# Patient Record
Sex: Male | Born: 1965 | Race: White | Hispanic: No | Marital: Married | Smoking: Never smoker
Health system: Southern US, Community
[De-identification: ages and names within clinical notes are randomized; demographics above are authoritative.]

## PROBLEM LIST (undated history)

## (undated) DIAGNOSIS — Y9389 Activity, other specified: Secondary | ICD-10-CM

## (undated) DIAGNOSIS — T782XXA Anaphylactic shock, unspecified, initial encounter: Secondary | ICD-10-CM

## (undated) DIAGNOSIS — K219 Gastro-esophageal reflux disease without esophagitis: Secondary | ICD-10-CM

## (undated) DIAGNOSIS — I10 Essential (primary) hypertension: Secondary | ICD-10-CM

## (undated) HISTORY — DX: Anaphylactic shock, unspecified, initial encounter: T78.2XXA

## (undated) HISTORY — DX: Activity, other specified: Y93.89

---

## 2011-11-28 DIAGNOSIS — T782XXA Anaphylactic shock, unspecified, initial encounter: Secondary | ICD-10-CM

## 2011-11-28 HISTORY — DX: Anaphylactic shock, unspecified, initial encounter: T78.2XXA

## 2013-07-31 ENCOUNTER — Emergency Department
Admission: EM | Admit: 2013-07-31 | Discharge: 2013-07-31 | Disposition: A | Discharge status: Home / Self Care | Payer: No Typology Code available for payment source | Source: Home / Self Care | Attending: Emergency Medicine | Admitting: Emergency Medicine

## 2013-07-31 ENCOUNTER — Encounter: Payer: Self-pay | Admitting: Emergency Medicine

## 2013-07-31 DIAGNOSIS — J209 Acute bronchitis, unspecified: Secondary | ICD-10-CM

## 2013-07-31 MED ORDER — AZITHROMYCIN 250 MG PO TABS: ORAL_TABLET | ORAL | Status: DC

## 2013-07-31 MED ORDER — FLUTICASONE PROPIONATE 50 MCG/ACT NA SUSP
NASAL | Status: DC
Start: 2013-07-31 — End: 2015-02-26

## 2013-07-31 MED ORDER — PROMETHAZINE-CODEINE 6.25-10 MG/5ML PO SYRP: ORAL_SOLUTION | ORAL | Status: DC

## 2013-07-31 NOTE — ED Provider Notes (Signed)
CSN: [id number]     Arrival date & time 07/31/13  1257 History   First MD Initiated Contact with Patient 07/31/13 1302     Chief Complaint  Patient presents with  . Cough    x 5 days  . Fever    x 5 days  . Generalized Body Aches    x 5 days   (Consider location/radiation/quality/duration/timing/severity/associated sxs/prior Treatment) HPI URI HISTORY  Onalee HuaDavid is a 48 y.o. male who complains of onset of cold symptoms for 5 days.  Have been using over-the-counter treatment which helps a little bit.--- It's progressively worsening  No chills/sweats +  Fever  +  Nasal congestion +  Discolored, green Post-nasal drainage + sinus pain/pressure No sore throat  +  Hacking, nonproductive cough No wheezing + mild chest congestion No hemoptysis No shortness of breath No pleuritic pain  No itchy/red eyes No earache  No nausea No vomiting No abdominal pain No diarrhea  No skin rashes +  Fatigue No myalgias No headache   History reviewed. No pertinent past medical history. History reviewed. No pertinent past surgical history. History reviewed. No pertinent family history. History  Substance Use Topics  . Smoking status: Never Smoker   . Smokeless tobacco: Never Used  . Alcohol Use: No    Review of Systems  All other systems reviewed and are negative.    Allergies  Penicillins  Home Medications   Current Outpatient Rx  Name  Route  Sig  Dispense  Refill  . cetirizine (ZYRTEC) 10 MG tablet   Oral   Take 10 mg by mouth daily.         . pseudoephedrine-guaifenesin (MUCINEX D) 60-600 MG per tablet   Oral   Take 1 tablet by mouth every 12 (twelve) hours.         Marland Kitchen. azithromycin (ZITHROMAX Z-PAK) 250 MG tablet      Take 2 tablets on day one, then 1 tablet daily on days 2 through 5   1 each   0   . fluticasone (FLONASE) 50 MCG/ACT nasal spray      1 or 2 sprays each nostril twice a day   16 g   0   . promethazine-codeine (PHENERGAN WITH CODEINE)  6.25-10 MG/5ML syrup      Take 1-2 teaspoons every 4-6 hours as needed for cough. May cause drowsiness.   120 mL   0    BP 167/112  Pulse 80  Temp(Src) 99.1 F (37.3 C) (Oral)  Ht 5\' 10"  (1.778 m)  Wt 200 lb (90.719 kg)  BMI 28.70 kg/m2  SpO2 96% Physical Exam  Nursing note and vitals reviewed. Constitutional: He is oriented to person, place, and time. He appears well-developed and well-nourished. No distress.  HENT:  Head: Normocephalic and atraumatic.  Right Ear: Tympanic membrane, external ear and ear canal normal.  Left Ear: Tympanic membrane, external ear and ear canal normal.  Nose: Mucosal edema and rhinorrhea present. Right sinus exhibits maxillary sinus tenderness. Left sinus exhibits maxillary sinus tenderness.  Mouth/Throat: Oropharynx is clear and moist. No oral lesions. No oropharyngeal exudate.  Eyes: Right eye exhibits no discharge. Left eye exhibits no discharge. No scleral icterus.  Neck: Neck supple.  Cardiovascular: Normal rate, regular rhythm and normal heart sounds.   Pulmonary/Chest: Effort normal. No respiratory distress. He has no wheezes. He has rhonchi. He has no rales.  Lymphadenopathy:    He has no cervical adenopathy.  Neurological: He is alert and oriented to person, place,  and time.  Skin: Skin is warm and dry.    ED Course  Procedures (including critical care time) Labs Review Labs Reviewed - No data to display Imaging Review No results found.   MDM   1. Acute bronchitis    Acute bronchitis and acute maxillary sinusitis. Treatment options discussed, as well as risks, benefits, alternatives. Patient voiced understanding and agreement with the following plans: Z-Pak Flonase Phenergan with codeine cough syrup prescribed, precautions discussed I recheck BP right arm sitting, BP 145/88.--Advised to avoid decongestants which could raise BP. Use Mucinex and other symptomatic care discussed Followup with PCP within 2 weeks to have BP  rechecked, sooner if worse or new symptoms. Precautions discussed. Red flags discussed. Questions invited and answered. Patient and wife voiced understanding and agreement.     Lajean Manes, MD 07/31/13 [phone]

## 2013-07-31 NOTE — ED Notes (Signed)
Mark HuaDavid complains of fevers, body aches, dizziness, headaches, runny nose, sneezing, congestion, wheezing, productive cough with green sputum, shortness of breath and chest pain for 5 days.

## 2013-10-21 ENCOUNTER — Emergency Department
Admission: EM | Admit: 2013-10-21 | Discharge: 2013-10-21 | Disposition: A | Discharge status: Home / Self Care | Payer: No Typology Code available for payment source | Source: Home / Self Care | Attending: Emergency Medicine | Admitting: Emergency Medicine

## 2013-10-21 ENCOUNTER — Encounter: Payer: Self-pay | Admitting: Emergency Medicine

## 2013-10-21 DIAGNOSIS — J069 Acute upper respiratory infection, unspecified: Secondary | ICD-10-CM

## 2013-10-21 HISTORY — DX: Essential (primary) hypertension: I10

## 2013-10-21 HISTORY — DX: Gastro-esophageal reflux disease without esophagitis: K21.9

## 2013-10-21 LAB — POCT RAPID STREP A (OFFICE): Rapid Strep A Screen: NEGATIVE

## 2013-10-21 MED ORDER — AZITHROMYCIN 250 MG PO TABS: ORAL_TABLET | ORAL | Status: DC

## 2013-10-21 MED ORDER — METHYLPREDNISOLONE SODIUM SUCC 125 MG IJ SOLR
125.0000 mg | Freq: Once | INTRAMUSCULAR | Status: AC
  Administered 2013-10-21: 125 mg via INTRAMUSCULAR

## 2013-10-21 NOTE — ED Notes (Signed)
Pt c/o body aches, productive cough, and night sweats x 3 days.

## 2013-10-21 NOTE — ED Provider Notes (Signed)
CSN: [id number]     Arrival date & time 10/21/13  1123 History   First MD Initiated Contact with Patient 10/21/13 1125     Chief Complaint  Patient presents with  . Cough  . Generalized Body Aches   (Consider location/radiation/quality/duration/timing/severity/associated sxs/prior Treatment) HPI Mark Romero is a 48 y.o. male who complains of onset of cold symptoms for 4 days.  The symptoms are constant and mild-moderate in severity.  He is mostly concerned because a few months ago he had similar symptoms that turned much worse.  He was around some and this past weekend who has been sick for about a month.  Also possible exposure to strep throat. +  sore throat + cough No pleuritic pain No wheezing + nasal congestion + post-nasal drainage No sinus pain/pressure + chest congestion No itchy/red eyes No earache No hemoptysis No SOB + chills/sweats No fever No nausea No vomiting No abdominal pain No diarrhea No skin rashes + fatigue No headache     Past Medical History  Diagnosis Date  . Hypertension   . GERD (gastroesophageal reflux disease)    History reviewed. No pertinent past surgical history. History reviewed. No pertinent family history. History  Substance Use Topics  . Smoking status: Never Smoker   . Smokeless tobacco: Never Used  . Alcohol Use: No    Review of Systems  All other systems reviewed and are negative.   Allergies  Penicillins  Home Medications   Prior to Admission medications   Medication Sig Start Date End Date Taking? Authorizing Provider  azithromycin (ZITHROMAX Z-PAK) 250 MG tablet Take 2 tablets on day one, then 1 tablet daily on days 2 through 5 07/31/13   Lajean Manesavid Massey, MD  cetirizine (ZYRTEC) 10 MG tablet Take 10 mg by mouth daily.    Historical Provider, MD  fluticasone Aleda Grana(FLONASE) 50 MCG/ACT nasal spray 1 or 2 sprays each nostril twice a day 07/31/13   Lajean Manesavid Massey, MD  promethazine-codeine [facility](PHENERGAN WITH CODEINE) 6.25-10 MG/5ML syrup Take  1-2 teaspoons every 4-6 hours as needed for cough. May cause drowsiness. 07/31/13   Lajean Manesavid Massey, MD  pseudoephedrine-guaifenesin [facility](MUCINEX D) 60-600 MG per tablet Take 1 tablet by mouth every 12 (twelve) hours.    Historical Provider, MD   BP 122/82  Pulse 66  Temp(Src) 98.1 F (36.7 C) (Oral)  Resp 18  Ht 5\' 10"  (1.778 m)  Wt 192 lb (87.091 kg)  BMI 27.55 kg/m2  SpO2 98% Physical Exam  Nursing note and vitals reviewed. Constitutional: He is oriented to person, place, and time. He appears well-developed and well-nourished.  HENT:  Head: Normocephalic and atraumatic.  Right Ear: Tympanic membrane, external ear and ear canal normal.  Left Ear: Tympanic membrane, external ear and ear canal normal.  Nose: Mucosal edema and rhinorrhea present.  Mouth/Throat: Posterior oropharyngeal erythema present. No oropharyngeal exudate or posterior oropharyngeal edema.  Eyes: No scleral icterus.  Neck: Neck supple.  Cardiovascular: Regular rhythm and normal heart sounds.   Pulmonary/Chest: Effort normal and breath sounds normal. No respiratory distress. He has no decreased breath sounds. He has no wheezes. He has no rhonchi.  Neurological: He is alert and oriented to person, place, and time.  Skin: Skin is warm and dry.  Psychiatric: He has a normal mood and affect. His speech is normal.    ED Course  Procedures (including critical care time) Labs Review Labs Reviewed  POCT RAPID STREP A (OFFICE)    Imaging Review No results found.   MDM  1. Acute upper respiratory infections of unspecified site    1)  Take the prescribed antibiotic as instructed.  IM Solumedrol given.  Rapid strep neg, no culture. 2)  Use nasal saline solution (over the counter) at least 3 times a day. 3)  Use over the counter decongestants like Zyrtec-D every 12 hours as needed to help with congestion.  If you have hypertension, do not take medicines with sudafed.  4)  Can take tylenol every 6 hours or motrin every 8  hours for pain or fever. 5)  Follow up with your primary doctor if no improvement in 5-7 days, sooner if increasing pain, fever, or new symptoms.       Marlaine Hind, MD 10/21/13 [phone]

## 2015-02-26 ENCOUNTER — Encounter: Payer: Self-pay | Admitting: Sports Medicine

## 2015-02-26 ENCOUNTER — Ambulatory Visit (INDEPENDENT_AMBULATORY_CARE_PROVIDER_SITE_OTHER): Payer: No Typology Code available for payment source | Admitting: Sports Medicine

## 2015-02-26 ENCOUNTER — Ambulatory Visit (INDEPENDENT_AMBULATORY_CARE_PROVIDER_SITE_OTHER): Payer: No Typology Code available for payment source

## 2015-02-26 VITALS — BP 168/106 | HR 67 | Ht 70.0 in | Wt 197.0 lb

## 2015-02-26 DIAGNOSIS — M5416 Radiculopathy, lumbar region: Secondary | ICD-10-CM | POA: Diagnosis not present

## 2015-02-26 DIAGNOSIS — I10 Essential (primary) hypertension: Secondary | ICD-10-CM

## 2015-02-26 MED ORDER — MELOXICAM 15 MG PO TABS: ORAL_TABLET | ORAL | Status: DC

## 2015-02-26 MED ORDER — LISINOPRIL-HYDROCHLOROTHIAZIDE 10-12.5 MG PO TABS: 1.0000 | ORAL_TABLET | Freq: Every day | ORAL | Status: DC

## 2015-02-26 MED ORDER — PREDNISONE 50 MG PO TABS: ORAL_TABLET | ORAL | Status: DC

## 2015-02-26 NOTE — Assessment & Plan Note (Signed)
We will start conservatively with formal physical therapy, x-rays, prednisone, meloxicam. He will return for custom orthotics.  Return to see me in one month after that.

## 2015-02-26 NOTE — Assessment & Plan Note (Signed)
It sounds as the patient is done some dieting and exercise. Blood pressure continues to be elevated so we will start lisinopril/chlorothiazide.

## 2015-02-26 NOTE — Progress Notes (Signed)
   Subjective:    I'm seeing this patient as a consultation for:  Dr. Angelena Sole  CC: leg numbness and tingling  HPI: For over a year this pleasant 49 year old male has had numbness and tingling that runs down the posterior lateral aspect of his left thigh, to the left lower leg but not to the foot, symptoms are moderate, persistent, worse with running, standing. They are not worse with flexion, Valsalva, or driving a car, no constitutional symptoms and no bowel or bladder dysfunction.  Elevated blood pressure: Persistent, he has tried last on modification but unfortunately continues to be elevated, no headaches, visual changes, chest pain.  Past medical history, Surgical history, Family history not pertinant except as noted below, Social history, Allergies, and medications have been entered into the medical record, reviewed, and no changes needed.   Review of Systems: No headache, visual changes, nausea, vomiting, diarrhea, constipation, dizziness, abdominal pain, skin rash, fevers, chills, night sweats, weight loss, swollen lymph nodes, body aches, joint swelling, muscle aches, chest pain, shortness of breath, mood changes, visual or auditory hallucinations.   Objective:   General: Well Developed, well nourished, and in no acute distress.  Neuro/Psych: Alert and oriented x3, extra-ocular muscles intact, able to move all 4 extremities, sensation grossly intact. Skin: Warm and dry, no rashes noted.  Respiratory: Not using accessory muscles, speaking in full sentences, trachea midline.  Cardiovascular: Pulses palpable, no extremity edema. Abdomen: Does not appear distended. Back Exam:  Inspection: Unremarkable  Motion: Flexion 45 deg, Extension 45 deg, Side Bending to 45 deg bilaterally,  Rotation to 45 deg bilaterally  SLR laying: Negative  XSLR laying: Negative  Palpable tenderness: None. FABER: negative. Sensory change: Gross sensation intact to all lumbar and sacral dermatomes.   Reflexes: 2+ at both patellar tendons, 2+ at achilles tendons, Babinski's downgoing.  Strength at foot  Plantar-flexion: 5/5 Dorsi-flexion: 5/5 Eversion: 5/5 Inversion: 5/5  Leg strength  Quad: 5/5 Hamstring: 5/5 Hip flexor: 5/5 Hip abductors: 5/5  Gait unremarkable.  X-rays do show multilevel lumbar spondylosis but particularly with a L3 pars interarticularis defect bilaterally. No evidence of spondylolisthesis.  Impression and Recommendations:   This case required medical decision making of moderate complexity.

## 2015-03-01 ENCOUNTER — Encounter: Payer: Self-pay | Admitting: Rehabilitative and Restorative Service Providers"

## 2015-03-01 ENCOUNTER — Ambulatory Visit (INDEPENDENT_AMBULATORY_CARE_PROVIDER_SITE_OTHER): Payer: No Typology Code available for payment source | Admitting: Rehabilitative and Restorative Service Providers"

## 2015-03-01 DIAGNOSIS — Z7409 Other reduced mobility: Secondary | ICD-10-CM

## 2015-03-01 DIAGNOSIS — M623 Immobility syndrome (paraplegic): Secondary | ICD-10-CM

## 2015-03-01 DIAGNOSIS — M2560 Stiffness of unspecified joint, not elsewhere classified: Secondary | ICD-10-CM

## 2015-03-01 DIAGNOSIS — M5442 Lumbago with sciatica, left side: Secondary | ICD-10-CM | POA: Diagnosis not present

## 2015-03-01 NOTE — Patient Instructions (Signed)
Trunk: Prone Extension (Press-Ups)    Lie on stomach on firm, flat surface. Relax bottom and legs. Raise chest in air with elbows straight. Keep hips flat on surface, sag stomach. Hold __5__ seconds. Repeat __10__ times. Do _2-5___ sessions per day. CAUTION: Movement should be gentle and slow.    Prone prop - prop on elbows or forearms 1-5 min 2-3 times/day    Trunk Extension  Standing, place back of open hands on low back. Straighten spine then arch the back and move shoulders back. Repeat __2-3__ times per session. Do __2-3__ sessions per day    Gastroc Stretch  Stand with right foot back, leg straight, forward leg bent. Keeping heel on floor, turned slightly out, lean into wall until stretch is felt in calf. Hold ____ seconds. Repeat ____ times per set. Do ____ sets per session. Do ____ sessions per day.    Achilles / Soleus, Standing  Stand, right foot behind, heel on floor and turned slightly out. Lower hips and bend knees. Hold ___ seconds. Repeat ___ times per session. Do ___ sessions per day.    Abdominal Bracing With Pelvic Floor (Hook-Lying)    With neutral spine, tighten pelvic floor and abdominals, suck belly button to back bone. Tighten muscles in back at waist. Hold 10 sec  Repeat _10__ times. Do _several__ times a day. Progress to do this exercise in sitting; standing; and walking as well as with functional activities.

## 2015-03-01 NOTE — Therapy (Addendum)
[facility] [address] [place], [place], [address] Phone: [phone]   Fax:  [phone]  Physical Therapy Treatment  Patient Details  Name: Mark Romero MRN: [id number] Date of Birth: 08/09/1965 Referring Provider:  Silverio Decamp,*  Encounter Date: 03/01/2015      PT End of Session - 03/01/15 1533    Visit Number 1   Number of Visits 6   Date for PT Re-Evaluation 04/12/15   PT Start Time 2703   PT Stop Time 1620   PT Time Calculation (min) 46 min   Activity Tolerance Patient tolerated treatment well      Past Medical History  Diagnosis Date  . Hypertension   . GERD (gastroesophageal reflux disease)     History reviewed. No pertinent past surgical history.  There were no vitals filed for this visit.  Visit Diagnosis:  Left-sided low back pain with left-sided sciatica - Plan: PT plan of care cert/re-cert  Stiffness due to immobility - Plan: PT plan of care cert/re-cert  Impaired mobility and endurance - Plan: PT plan of care cert/re-cert      Subjective Assessment - 03/01/15 1539    Subjective Patient reports that he has had back pain for a period of 5-6 years with symptoms flaring up intermittently. Dx with DDD L1/2 and L3/4. Patient reports pain with running; prolonged standing; walking. Symptoms worse in the past year. Worse when running 3-5 miles.    Pertinent History LBP; Lt sciatica - has tried massage with no improvement.    How long can you sit comfortably? no limit   How long can you stand comfortably? 15-20 min symptoms LB Lt LE down into leg/calf   How long can you walk comfortably? 20-30 min symptoms in LB and Lt LE knee down goes to sleep   Diagnostic tests MRI   Patient [place] again - would like to run 3-5 miles without calf pain   Currently in Pain? Yes   Pain Score 0-No pain   Pain Location Back   Pain Orientation Left   Pain Descriptors / Indicators Dull;Aching  when  he has pain   Pain Type Chronic pain   Pain Radiating Towards inot Lt hip and leg    Pain Onset More than a month ago   Pain Frequency Intermittent   Aggravating Factors  standing; walking; running   Pain Relieving Factors stopping the activitiy; deep tissue massage temp relief                                 PT Education - 03/01/15 1811    Education provided Yes   Education Details Spine education; body mechanics; extension program; stretching for gastroc/soleus; HEP   Person(s) Educated Patient   Methods Explanation;Demonstration;Tactile cues;Verbal cues;Handout   Comprehension Verbalized understanding;Returned demonstration;Verbal cues required;Tactile cues required             PT Long Term Goals - 03/01/15 1824    PT LONG TERM GOAL #1   Title Patient I in HEP at discharge 04/12/15   Time 6   Period Weeks   Status New   PT LONG TERM GOAL #2   Title Patient to tolerate standing and walking for 45-60 min 04/12/15   Time 6   Period Weeks   Status New   PT LONG TERM GOAL #3   Title [place] to run 1-2 miles with no calf tightness/spasms 04/12/15  Time 6   Period Weeks   Status New   PT LONG TERM GOAL #4   Title Improve FOTO to </= 27% limitation 04/12/15   Time 6   Period Weeks               Plan - 03/01/15 1813    Clinical Impression Statement Patient presents with history of chornic, recurrent LBP with Lt LE radicular pain. He has had increased symptoms over the past year including LBP and Lt hip/buttock and LE numbness. He also c/o's of pain and spasms in bilat calves at different times. Lumbar and Lt hip/buttock symptoms appear discogenic in nature and calf pain appears to be muscular  and related to adaptive shortening with functional activities worsened by running. He will benefit from PT to improve core stability and begin [place] extension program for lumbar spine and appropriate stretching program for LE's.    Pt will benefit  from skilled therapeutic intervention in order to improve on the following deficits Pain;Increased fascial restricitons;Increased muscle spasms;Decreased activity tolerance;Decreased endurance   Rehab Potential Good   PT Frequency 1x / week   PT Duration 6 weeks   PT Treatment/Interventions Patient/family education;ADLs/Self Care Home Management;Therapeutic exercise;Therapeutic activities;Neuromuscular re-education;Dry needling;Cryotherapy;Electrical Stimulation;Moist Heat;Ultrasound;Traction   PT Next Visit Plan review HEP; progress with extensioin program for lumbar spine; progress with core stabilization program   PT Home Exercise Plan HEP   Consulted and Agree with Plan of Care Patient        Problem List Patient Active Problem List   Diagnosis Date Noted  . Left lumbar radiculopathy 02/26/2015  . Essential hypertension, benign 02/26/2015    Celyn Nilda Simmer PT, MPH 03/02/2015, 9:36 AM  [facility] [place], [place], [address] Phone: [phone]   Fax:  [phone]     PHYSICAL THERAPY DISCHARGE SUMMARY  Visits from Start of Care: eval only  Current functional level related to goals / functional outcomes: Patient was seen for evaluation only and has not returned for further treatment.    Remaining deficits: unknown   Education / Equipment: HEP  Plan: Patient agrees to discharge.  Patient goals were not met. Patient is being discharged due to not returning since the last visit.  ?????    Celyn P. Helene Kelp PT, MPH 03/28/2015 1:12 PM

## 2015-03-06 ENCOUNTER — Encounter: Payer: Self-pay | Admitting: Sports Medicine

## 2015-03-06 ENCOUNTER — Ambulatory Visit (INDEPENDENT_AMBULATORY_CARE_PROVIDER_SITE_OTHER): Payer: No Typology Code available for payment source | Admitting: Sports Medicine

## 2015-03-06 VITALS — BP 150/94 | HR 68 | Ht 70.0 in | Wt 195.0 lb

## 2015-03-06 DIAGNOSIS — I10 Essential (primary) hypertension: Secondary | ICD-10-CM | POA: Diagnosis not present

## 2015-03-06 DIAGNOSIS — M5416 Radiculopathy, lumbar region: Secondary | ICD-10-CM

## 2015-03-06 MED ORDER — LISINOPRIL-HYDROCHLOROTHIAZIDE 20-25 MG PO TABS: 1.0000 | ORAL_TABLET | Freq: Every day | ORAL | Status: DC

## 2015-03-06 NOTE — Progress Notes (Signed)

## 2015-03-06 NOTE — Assessment & Plan Note (Signed)
Improving with physical therapy, has not on prednisone, custom orthotics as above, return to see me in 3 weeks.

## 2015-03-06 NOTE — Assessment & Plan Note (Signed)
Still elevated but has only had about 1 week of blood pressure medication. We are going to double lisinopril/chlorothiazide.

## 2015-03-07 ENCOUNTER — Encounter: Payer: No Typology Code available for payment source | Admitting: Physical Therapy

## 2015-03-27 ENCOUNTER — Ambulatory Visit (INDEPENDENT_AMBULATORY_CARE_PROVIDER_SITE_OTHER): Payer: No Typology Code available for payment source | Admitting: Sports Medicine

## 2015-03-27 ENCOUNTER — Encounter: Payer: Self-pay | Admitting: Sports Medicine

## 2015-03-27 VITALS — BP 139/85 | HR 68 | Ht 70.0 in | Wt 196.0 lb

## 2015-03-27 DIAGNOSIS — I10 Essential (primary) hypertension: Secondary | ICD-10-CM

## 2015-03-27 DIAGNOSIS — M5416 Radiculopathy, lumbar region: Secondary | ICD-10-CM

## 2015-03-27 NOTE — Assessment & Plan Note (Signed)
Controlled, return as needed.

## 2015-03-27 NOTE — Assessment & Plan Note (Signed)
Persistent left-sided lumbar radiculopathy that did improve significantly with physical therapy and orthotics. At this point we are going to proceed with an MRI for interventional planning.

## 2015-03-27 NOTE — Progress Notes (Signed)
  Subjective:    CC: follow-up  HPI: Hypertension: Now controlled.  Left lumbar radiculopathy: Improved significantly with physical therapy, he does however have some persistent pain, and desires to proceed to the next step, radiculopathy is predominantly left-sided L5 versus S1.  Past medical history, Surgical history, Family history not pertinant except as noted below, Social history, Allergies, and medications have been entered into the medical record, reviewed, and no changes needed.   Review of Systems: No fevers, chills, night sweats, weight loss, chest pain, or shortness of breath.   Objective:    General: Well Developed, well nourished, and in no acute distress.  Neuro: Alert and oriented x3, extra-ocular muscles intact, sensation grossly intact.  HEENT: Normocephalic, atraumatic, pupils equal round reactive to light, neck supple, no masses, no lymphadenopathy, thyroid nonpalpable.  Skin: Warm and dry, no rashes. Cardiac: Regular rate and rhythm, no murmurs rubs or gallops, no lower extremity edema.  Respiratory: Clear to auscultation bilaterally. Not using accessory muscles, speaking in full sentences.  Impression and Recommendations:    I spent 25 minutes with this patient, greater than 50% was face-to-face time counseling regarding the above diagnoses

## 2015-04-09 ENCOUNTER — Ambulatory Visit (INDEPENDENT_AMBULATORY_CARE_PROVIDER_SITE_OTHER): Payer: No Typology Code available for payment source

## 2015-04-09 DIAGNOSIS — M5126 Other intervertebral disc displacement, lumbar region: Secondary | ICD-10-CM | POA: Diagnosis not present

## 2015-04-09 DIAGNOSIS — M5416 Radiculopathy, lumbar region: Secondary | ICD-10-CM

## 2015-04-16 ENCOUNTER — Telehealth: Payer: Self-pay | Admitting: Sports Medicine

## 2015-04-16 NOTE — Telephone Encounter (Signed)
Pt called to get results from recent MRI L-Spine. Will route to ordering Provider for review.

## 2015-04-16 NOTE — Telephone Encounter (Signed)
Attempted to return clinic call. Left voicemail informing Pt to contact clinic for MRI results and to schedule a follow up appointment to look over imaging in person. Callback information was provided.

## 2015-04-16 NOTE — Telephone Encounter (Signed)
There are bilateral pars defects at the L3 vertebrae, with crowding of the L3 nerve roots. Should probably follow-up to look at the images with me and come up with a good plan, this will likely not need surgery, and we can probably get this better with conservative measures including injections.

## 2015-04-18 ENCOUNTER — Ambulatory Visit (INDEPENDENT_AMBULATORY_CARE_PROVIDER_SITE_OTHER): Payer: No Typology Code available for payment source | Admitting: Sports Medicine

## 2015-04-18 DIAGNOSIS — M5416 Radiculopathy, lumbar region: Secondary | ICD-10-CM

## 2015-04-18 NOTE — Assessment & Plan Note (Signed)
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 We are going to proceed with a left L3-L4 interlaminar epidural.  Return to see me one month after the injection to evaluate response.

## 2015-04-18 NOTE — Progress Notes (Signed)
  Subjective:    CC: MRI results  HPI: Left lumbar radiculopathy: Mark Romero returns, he continues to have left-sided paresthesias, in no specific distribution, we obtained an MRI, the results of which will be dictated below. Symptoms continue to be moderate, persistent, no bowel or bladder dysfunction, saddle numbness, constitutional symptoms.  Past medical history, Surgical history, Family history not pertinant except as noted below, Social history, Allergies, and medications have been entered into the medical record, reviewed, and no changes needed.   Review of Systems: No fevers, chills, night sweats, weight loss, chest pain, or shortness of breath.   Objective:    General: Well Developed, well nourished, and in no acute distress.  Neuro: Alert and oriented x3, extra-ocular muscles intact, sensation grossly intact.  HEENT: Normocephalic, atraumatic, pupils equal round reactive to light, neck supple, no masses, no lymphadenopathy, thyroid nonpalpable.  Skin: Warm and dry, no rashes. Cardiac: Regular rate and rhythm, no murmurs rubs or gallops, no lower extremity edema.  Respiratory: Clear to auscultation bilaterally. Not using accessory muscles, speaking in full sentences.  MRI personally reviewed, shows bilateral pars defects at the L3-L4 level, with normal anterolisthesis, L3-L4 degenerative disc disease with a broad-based biforaminal component, as well as L4-L5 degenerative disc disease, broad-based without any neural impingement.  Impression and Recommendations:    I spent 25 minutes with this patient, greater than 50% was face-to-face time counseling regarding the above diagnoses

## 2015-05-16 ENCOUNTER — Ambulatory Visit
Admission: RE | Admit: 2015-05-16 | Discharge: 2015-05-16 | Disposition: A | Discharge status: Home / Self Care | Payer: No Typology Code available for payment source | Source: Ambulatory Visit | Attending: Sports Medicine | Admitting: Sports Medicine

## 2015-05-16 MED ORDER — IOHEXOL 180 MG/ML  SOLN
1.0000 mL | Freq: Once | INTRAMUSCULAR | Status: AC | PRN
  Administered 2015-05-16: 1 mL via EPIDURAL

## 2015-05-16 MED ORDER — METHYLPREDNISOLONE ACETATE 40 MG/ML INJ SUSP (RADIOLOG
120.0000 mg | Freq: Once | INTRAMUSCULAR | Status: AC
  Administered 2015-05-16: 120 mg via EPIDURAL

## 2015-05-16 NOTE — Discharge Instructions (Signed)

## 2015-06-18 ENCOUNTER — Telehealth: Payer: Self-pay

## 2015-06-18 DIAGNOSIS — M5416 Radiculopathy, lumbar region: Secondary | ICD-10-CM

## 2015-06-18 NOTE — Telephone Encounter (Signed)
Pt would like to have a referral placed for another shot [facility]. Please assist.

## 2015-06-18 NOTE — Telephone Encounter (Signed)
Injection ordered

## 2015-06-19 NOTE — Telephone Encounter (Signed)
Contacted [facility] and left pt a msg saying order was placed.

## 2015-06-22 ENCOUNTER — Ambulatory Visit
Admission: RE | Admit: 2015-06-22 | Discharge: 2015-06-22 | Disposition: A | Discharge status: Home / Self Care | Payer: BLUE CROSS/BLUE SHIELD | Source: Ambulatory Visit | Attending: Sports Medicine | Admitting: Sports Medicine

## 2015-06-22 MED ORDER — IOHEXOL 180 MG/ML  SOLN
1.0000 mL | Freq: Once | INTRAMUSCULAR | Status: AC | PRN
  Administered 2015-06-22: 1 mL via EPIDURAL

## 2015-06-22 MED ORDER — METHYLPREDNISOLONE ACETATE 40 MG/ML INJ SUSP (RADIOLOG
120.0000 mg | Freq: Once | INTRAMUSCULAR | Status: AC
  Administered 2015-06-22: 120 mg via EPIDURAL

## 2015-07-23 ENCOUNTER — Other Ambulatory Visit: Payer: Self-pay | Admitting: Sports Medicine

## 2015-08-13 ENCOUNTER — Encounter: Payer: Self-pay | Admitting: *Deleted

## 2015-08-13 ENCOUNTER — Emergency Department (INDEPENDENT_AMBULATORY_CARE_PROVIDER_SITE_OTHER)
Admission: EM | Admit: 2015-08-13 | Discharge: 2015-08-13 | Disposition: A | Discharge status: Home / Self Care | Payer: BLUE CROSS/BLUE SHIELD | Source: Home / Self Care | Attending: Family Medicine | Admitting: Family Medicine

## 2015-08-13 DIAGNOSIS — H66005 Acute suppurative otitis media without spontaneous rupture of ear drum, recurrent, left ear: Secondary | ICD-10-CM

## 2015-08-13 DIAGNOSIS — H6092 Unspecified otitis externa, left ear: Secondary | ICD-10-CM | POA: Diagnosis not present

## 2015-08-13 MED ORDER — CIPROFLOXACIN-DEXAMETHASONE 0.3-0.1 % OT SUSP: 4.0000 [drp] | Freq: Two times a day (BID) | OTIC | Status: DC

## 2015-08-13 MED ORDER — CEFDINIR 300 MG PO CAPS: 300.0000 mg | ORAL_CAPSULE | Freq: Two times a day (BID) | ORAL | Status: DC

## 2015-08-13 NOTE — ED Notes (Signed)
Pt c/o LT ear pain x 1 wk. Denies fever. Reports hx of Otitis Media.

## 2015-08-13 NOTE — ED Provider Notes (Signed)
CSN: [id number]     Arrival date & time 08/13/15  1702 History   First MD Initiated Contact with Patient 08/13/15 1727     Chief Complaint  Patient presents with  . Otalgia      HPI Comments: Patient complains of left earache for about 8 days.  He has a history of recurrent otitis media.  The history is provided by the patient.    Past Medical History  Diagnosis Date  . Hypertension   . GERD (gastroesophageal reflux disease)    History reviewed. No pertinent past surgical history. History reviewed. No pertinent family history. Social History  Substance Use Topics  . Smoking status: Never Smoker   . Smokeless tobacco: Never Used  . Alcohol Use: No    Review of Systems No sore throat No cough No pleuritic pain No wheezing No nasal congestion No post-nasal drainage No sinus pain/pressure No itchy/red eyes + earache No hemoptysis No SOB No fever/chills No nausea No vomiting No abdominal pain No diarrhea No urinary symptoms No skin rash No fatigue No myalgias No headache Used OTC meds without relief  Allergies  Penicillins  Home Medications   Prior to Admission medications   Medication Sig Start Date End Date Taking? Authorizing Provider  cefdinir (OMNICEF) 300 MG capsule Take 1 capsule (300 mg total) by mouth 2 (two) times daily. 08/13/15   Lattie HawStephen A Ginia Rudell, MD  cetirizine (ZYRTEC) 10 MG tablet Take 10 mg by mouth daily.    Historical Provider, MD  ciprofloxacin-dexamethasone (CIPRODEX) otic suspension Place 4 drops into the left ear 2 (two) times daily. 08/13/15   Lattie HawStephen A Clerance Umland, MD  lisinopril-hydrochlorothiazide (PRINZIDE,ZESTORETIC) 20-25 MG tablet TAKE ONE TABLET BY MOUTH EVERY DAY 07/23/15   Monica Bectonhomas J Thekkekandam, MD   Meds Ordered and Administered this Visit  Medications - No data to display  BP 135/89 mmHg  Pulse 63  Temp(Src) 98.2 F (36.8 C) (Oral)  Resp 16  Ht 5\' 10"  (1.778 m)  Wt 199 lb (90.266 kg)  BMI 28.55 kg/m2  SpO2 98% No data  found.   Physical Exam Nursing notes and Vital Signs reviewed. Appearance:  Patient appears stated age, and in no acute distress Eyes:  Pupils are equal, round, and reactive to light and accomodation.  Extraocular movement is intact.  Conjunctivae are not inflamed  Ears:  Canals normal although there is tenderness with insertion of speculum in the left canal.  Right tympanic membrane normal.  Left tympanic membrane erythematous and opaque with decreased landmarks. Nose:  Mildly congested turbinates.  No sinus tenderness.  Pharynx:  Normal Neck:  Supple.   No adenopathy. Skin:  No rash present.   ED Course  Procedures none    Labs Reviewed -  Tympanogram:  Left ear low peak height;  Right ear positive peak pressure    MDM   1. Recurrent acute suppurative otitis media without spontaneous rupture of left tympanic membrane   2. Left otitis externa    Begin Omnicef, and Ciprodex Otic suspension May use Afrin nasal spray (or generic oxymetazoline) twice daily for about 5 days and then discontinue.  Also recommend using saline nasal spray several times daily and saline nasal irrigation (AYR is a common brand).  Use Flonase nasal spray each morning after using Afrin nasal spray and saline nasal irrigation. Followup with ENT if not resolved 10 days.    Lattie HawStephen A Honest Vanleer, MD 08/18/15 [id number]

## 2015-08-13 NOTE — Discharge Instructions (Signed)
May use Afrin nasal spray (or generic oxymetazoline) twice daily for about 5 days and then discontinue.  Also recommend using saline nasal spray several times daily and saline nasal irrigation (AYR is a common brand).  Use Flonase nasal spray each morning after using Afrin nasal spray and saline nasal irrigation. °  °

## 2015-08-24 ENCOUNTER — Telehealth: Payer: Self-pay | Admitting: Emergency Medicine

## 2015-08-24 MED ORDER — PREDNISONE 20 MG PO TABS: ORAL_TABLET | ORAL | Status: DC

## 2015-08-29 ENCOUNTER — Other Ambulatory Visit: Payer: Self-pay | Admitting: Sports Medicine

## 2015-09-06 ENCOUNTER — Other Ambulatory Visit: Payer: Self-pay | Admitting: Sports Medicine

## 2015-10-15 ENCOUNTER — Other Ambulatory Visit: Payer: Self-pay | Admitting: Sports Medicine

## 2015-11-20 ENCOUNTER — Other Ambulatory Visit: Payer: Self-pay | Admitting: Sports Medicine

## 2015-12-24 ENCOUNTER — Other Ambulatory Visit: Payer: Self-pay | Admitting: Sports Medicine

## 2016-01-30 ENCOUNTER — Other Ambulatory Visit: Payer: Self-pay | Admitting: Sports Medicine

## 2016-02-13 DIAGNOSIS — R5383 Other fatigue: Secondary | ICD-10-CM | POA: Diagnosis not present

## 2016-02-13 DIAGNOSIS — E291 Testicular hypofunction: Secondary | ICD-10-CM | POA: Diagnosis not present

## 2016-02-13 DIAGNOSIS — E559 Vitamin D deficiency, unspecified: Secondary | ICD-10-CM | POA: Diagnosis not present

## 2016-02-13 DIAGNOSIS — I10 Essential (primary) hypertension: Secondary | ICD-10-CM | POA: Diagnosis not present

## 2016-02-13 DIAGNOSIS — E663 Overweight: Secondary | ICD-10-CM | POA: Diagnosis not present

## 2016-03-10 ENCOUNTER — Other Ambulatory Visit: Payer: Self-pay | Admitting: Sports Medicine

## 2016-03-28 ENCOUNTER — Other Ambulatory Visit: Payer: Self-pay | Admitting: Sports Medicine

## 2016-04-15 ENCOUNTER — Other Ambulatory Visit: Payer: Self-pay | Admitting: Sports Medicine

## 2016-05-02 ENCOUNTER — Other Ambulatory Visit: Payer: Self-pay | Admitting: Sports Medicine

## 2016-05-22 ENCOUNTER — Other Ambulatory Visit: Payer: Self-pay | Admitting: Sports Medicine

## 2016-06-16 ENCOUNTER — Other Ambulatory Visit: Payer: Self-pay | Admitting: Sports Medicine

## 2016-07-04 ENCOUNTER — Other Ambulatory Visit: Payer: Self-pay | Admitting: Sports Medicine

## 2016-08-07 ENCOUNTER — Other Ambulatory Visit: Payer: Self-pay | Admitting: Sports Medicine

## 2016-08-12 DIAGNOSIS — E559 Vitamin D deficiency, unspecified: Secondary | ICD-10-CM | POA: Diagnosis not present

## 2016-08-12 DIAGNOSIS — R5381 Other malaise: Secondary | ICD-10-CM | POA: Diagnosis not present

## 2016-08-12 DIAGNOSIS — R6882 Decreased libido: Secondary | ICD-10-CM | POA: Diagnosis not present

## 2016-08-12 DIAGNOSIS — E291 Testicular hypofunction: Secondary | ICD-10-CM | POA: Diagnosis not present

## 2016-10-09 DIAGNOSIS — D2239 Melanocytic nevi of other parts of face: Secondary | ICD-10-CM | POA: Diagnosis not present

## 2016-10-09 DIAGNOSIS — L570 Actinic keratosis: Secondary | ICD-10-CM | POA: Diagnosis not present

## 2016-10-09 DIAGNOSIS — D485 Neoplasm of uncertain behavior of skin: Secondary | ICD-10-CM | POA: Diagnosis not present

## 2016-10-09 DIAGNOSIS — L814 Other melanin hyperpigmentation: Secondary | ICD-10-CM | POA: Diagnosis not present

## 2016-11-18 ENCOUNTER — Ambulatory Visit (INDEPENDENT_AMBULATORY_CARE_PROVIDER_SITE_OTHER): Payer: BLUE CROSS/BLUE SHIELD

## 2016-11-18 ENCOUNTER — Ambulatory Visit (INDEPENDENT_AMBULATORY_CARE_PROVIDER_SITE_OTHER): Payer: BLUE CROSS/BLUE SHIELD | Admitting: Sports Medicine

## 2016-11-18 ENCOUNTER — Encounter: Payer: Self-pay | Admitting: Sports Medicine

## 2016-11-18 DIAGNOSIS — M542 Cervicalgia: Secondary | ICD-10-CM | POA: Diagnosis not present

## 2016-11-18 DIAGNOSIS — I10 Essential (primary) hypertension: Secondary | ICD-10-CM | POA: Diagnosis not present

## 2016-11-18 DIAGNOSIS — M5412 Radiculopathy, cervical region: Secondary | ICD-10-CM | POA: Diagnosis not present

## 2016-11-18 DIAGNOSIS — M47812 Spondylosis without myelopathy or radiculopathy, cervical region: Secondary | ICD-10-CM | POA: Diagnosis not present

## 2016-11-18 MED ORDER — LISINOPRIL-HYDROCHLOROTHIAZIDE 20-25 MG PO TABS: ORAL_TABLET | ORAL | 0 refills | Status: DC

## 2016-11-18 MED ORDER — MELOXICAM 15 MG PO TABS: ORAL_TABLET | ORAL | 3 refills | Status: DC

## 2016-11-18 MED ORDER — PREDNISONE 50 MG PO TABS: ORAL_TABLET | ORAL | 0 refills | Status: DC

## 2016-11-18 NOTE — Progress Notes (Signed)
  Subjective:    CC: Multiple issues  HPI: Neck pain: Present for months, radiates down the right arm and a C6 and C7 distribution, worse with turning the head to the right moderate, persistent. No progressive weakness, no constitutional symptoms, no trauma.  Hypertension: Was well controlled on lisinopril/HCTZ, ran out of medication and blood pressure has since been elevated, no headaches, visual changes, chest pain. He needs to establish primary care with one of our partners.  Past medical history:  Negative.  See flowsheet/record as well for more information.  Surgical history: Negative.  See flowsheet/record as well for more information.  Family history: Negative.  See flowsheet/record as well for more information.  Social history: Negative.  See flowsheet/record as well for more information.  Allergies, and medications have been entered into the medical record, reviewed, and no changes needed.   Review of Systems: No fevers, chills, night sweats, weight loss, chest pain, or shortness of breath.   Objective:    General: Well Developed, well nourished, and in no acute distress.  Neuro: Alert and oriented x3, extra-ocular muscles intact, sensation grossly intact.  HEENT: Normocephalic, atraumatic, pupils equal round reactive to light, neck supple, no masses, no lymphadenopathy, thyroid nonpalpable.  Skin: Warm and dry, no rashes. Cardiac: Regular rate and rhythm, no murmurs rubs or gallops, no lower extremity edema.  Respiratory: Clear to auscultation bilaterally. Not using accessory muscles, speaking in full sentences. Neck: Negative spurling's Full neck range of motion Grip strength and sensation normal in bilateral hands Strength good C4 to T1 distribution No sensory change to C4 to T1 Reflexes normal  Impression and Recommendations:    Radiculitis of right cervical region Right C6 and C7 distribution radiculitis. We will start conservatively with x-rays, prednisone,  meloxicam, formal PT, return in one month, MRI for interventional planning if no better.  Essential hypertension, benign Blood pressure has been profoundly elevated off of his medication, refilling this and he will establish with one of my partners.

## 2016-11-18 NOTE — Assessment & Plan Note (Signed)
Right C6 and C7 distribution radiculitis. We will start conservatively with x-rays, prednisone, meloxicam, formal PT, return in one month, MRI for interventional planning if no better.

## 2016-11-18 NOTE — Assessment & Plan Note (Signed)
Blood pressure has been profoundly elevated off of his medication, refilling this and he will establish with one of my partners.

## 2016-12-16 ENCOUNTER — Ambulatory Visit (INDEPENDENT_AMBULATORY_CARE_PROVIDER_SITE_OTHER): Payer: BLUE CROSS/BLUE SHIELD | Admitting: Sports Medicine

## 2016-12-16 ENCOUNTER — Encounter: Payer: Self-pay | Admitting: Sports Medicine

## 2016-12-16 DIAGNOSIS — M5412 Radiculopathy, cervical region: Secondary | ICD-10-CM | POA: Diagnosis not present

## 2016-12-16 DIAGNOSIS — M5416 Radiculopathy, lumbar region: Secondary | ICD-10-CM | POA: Diagnosis not present

## 2016-12-16 NOTE — Assessment & Plan Note (Signed)
Right C6 and C7 distribution, did not do physical therapy, prednisone. He will do this over the next month and return to see me, MRI for interventional planning if no better.

## 2016-12-16 NOTE — Assessment & Plan Note (Signed)
Left L3-L4 interlaminar epidural years ago provided good relief, we will see if the prednisone helps his low back as well and if not we can proceed with another set of epidurals.

## 2016-12-16 NOTE — Progress Notes (Signed)
  Subjective:    CC: Follow-up  HPI: Cervical radiculitis: Didn't do physical therapy, or prednisone.  Low back pain: Known lumbar degenerative disc disease, has done well with epidurals a couple of years ago.   Past medical history:  Negative.  See flowsheet/record as well for more information.  Surgical history: Negative.  See flowsheet/record as well for more information.  Family history: Negative.  See flowsheet/record as well for more information.  Social history: Negative.  See flowsheet/record as well for more information.  Allergies, and medications have been entered into the medical record, reviewed, and no changes needed.   Review of Systems: No fevers, chills, night sweats, weight loss, chest pain, or shortness of breath.   Objective:    General: Well Developed, well nourished, and in no acute distress.  Neuro: Alert and oriented x3, extra-ocular muscles intact, sensation grossly intact.  HEENT: Normocephalic, atraumatic, pupils equal round reactive to light, neck supple, no masses, no lymphadenopathy, thyroid nonpalpable.  Skin: Warm and dry, no rashes. Cardiac: Regular rate and rhythm, no murmurs rubs or gallops, no lower extremity edema.  Respiratory: Clear to auscultation bilaterally. Not using accessory muscles, speaking in full sentences.  Impression and Recommendations:    Radiculitis of right cervical region Right C6 and C7 distribution, did not do physical therapy, prednisone. He will do this over the next month and return to see me, MRI for interventional planning if no better.  Left lumbar radiculopathy Left L3-L4 interlaminar epidural years ago provided good relief, we will see if the prednisone helps his low back as well and if not we can proceed with another set of epidurals.  I spent 25 minutes with this patient, greater than 50% was face-to-face time counseling regarding the above diagnoses

## 2016-12-22 ENCOUNTER — Ambulatory Visit (INDEPENDENT_AMBULATORY_CARE_PROVIDER_SITE_OTHER): Payer: BLUE CROSS/BLUE SHIELD | Admitting: Physician Assistant

## 2016-12-22 ENCOUNTER — Encounter: Payer: Self-pay | Admitting: Physician Assistant

## 2016-12-22 VITALS — BP 187/95 | HR 60 | Temp 98.4°F | Wt 201.0 lb

## 2016-12-22 DIAGNOSIS — Z7689 Persons encountering health services in other specified circumstances: Secondary | ICD-10-CM

## 2016-12-22 DIAGNOSIS — I10 Essential (primary) hypertension: Secondary | ICD-10-CM

## 2016-12-22 MED ORDER — LISINOPRIL-HYDROCHLOROTHIAZIDE 20-25 MG PO TABS: ORAL_TABLET | ORAL | 0 refills | Status: DC

## 2016-12-22 NOTE — Patient Instructions (Addendum)
For your blood pressure: - Re-start your lisinopril-hctz every morning - Check blood pressure at home for the next 2 weeks and log your readings - Check around the same time each day in a relaxed setting - Limit salt. Follow DASH eating plan - Follow-up in 2 weeks    DASH Eating Plan DASH stands for "Dietary Approaches to Stop Hypertension." The DASH eating plan is a healthy eating plan that has been shown to reduce high blood pressure (hypertension). It may also reduce your risk for type 2 diabetes, heart disease, and stroke. The DASH eating plan may also help with weight loss. What are tips for following this plan? General guidelines  Avoid eating more than 2,300 mg (milligrams) of salt (sodium) a day. If you have hypertension, you may need to reduce your sodium intake to 1,500 mg a day.  Limit alcohol intake to no more than 1 drink a day for nonpregnant women and 2 drinks a day for men. One drink equals 12 oz of beer, 5 oz of wine, or 1 oz of hard liquor.  Work with your health care provider to maintain a healthy body weight or to lose weight. Ask what an ideal weight is for you.  Get at least 30 minutes of exercise that causes your heart to beat faster (aerobic exercise) most days of the week. Activities may include walking, swimming, or biking.  Work with your health care provider or diet and nutrition specialist (dietitian) to adjust your eating plan to your individual calorie needs. Reading food labels  Check food labels for the amount of sodium per serving. Choose foods with less than 5 percent of the Daily Value of sodium. Generally, foods with less than 300 mg of sodium per serving fit into this eating plan.  To find whole grains, look for the word "whole" as the first word in the ingredient list. Shopping  Buy products labeled as "low-sodium" or "no salt added."  Buy fresh foods. Avoid canned foods and premade or frozen meals. Cooking  Avoid adding salt when cooking.  Use salt-free seasonings or herbs instead of table salt or sea salt. Check with your health care provider or pharmacist before using salt substitutes.  Do not fry foods. Cook foods using healthy methods such as baking, boiling, grilling, and broiling instead.  Cook with heart-healthy oils, such as olive, canola, soybean, or sunflower oil. Meal planning   Eat a balanced diet that includes: ? 5 or more servings of fruits and vegetables each day. At each meal, try to fill half of your plate with fruits and vegetables. ? Up to 6-8 servings of whole grains each day. ? Less than 6 oz of lean meat, poultry, or fish each day. A 3-oz serving of meat is about the same size as a deck of cards. One egg equals 1 oz. ? 2 servings of low-fat dairy each day. ? A serving of nuts, seeds, or beans 5 times each week. ? Heart-healthy fats. Healthy fats called Omega-3 fatty acids are found in foods such as flaxseeds and coldwater fish, like sardines, salmon, and mackerel.  Limit how much you eat of the following: ? Canned or prepackaged foods. ? Food that is high in trans fat, such as fried foods. ? Food that is high in saturated fat, such as fatty meat. ? Sweets, desserts, sugary drinks, and other foods with added sugar. ? Full-fat dairy products.  Do not salt foods before eating.  Try to eat at least 2 vegetarian meals each  week.  Eat more home-cooked food and less restaurant, buffet, and fast food.  When eating at a restaurant, ask that your food be prepared with less salt or no salt, if possible. What foods are recommended? The items listed may not be a complete list. Talk with your dietitian about what dietary choices are best for you. Grains Whole-grain or whole-wheat bread. Whole-grain or whole-wheat pasta. Brown rice. [place]ena Morrow. Bulgur. Whole-grain and low-sodium cereals. Pita bread. Low-fat, low-sodium crackers. Whole-wheat flour tortillas. Vegetables Fresh or frozen vegetables (raw,  steamed, roasted, or grilled). Low-sodium or reduced-sodium tomato and vegetable juice. Low-sodium or reduced-sodium tomato sauce and tomato paste. Low-sodium or reduced-sodium canned vegetables. Fruits All fresh, dried, or frozen fruit. Canned fruit in natural juice (without added sugar). Meat and other protein foods Skinless chicken or [place]. Ground chicken or [place]. Pork with fat trimmed off. Fish and seafood. Egg whites. Dried beans, peas, or lentils. Unsalted nuts, nut butters, and seeds. Unsalted canned beans. Lean cuts of beef with fat trimmed off. Low-sodium, lean deli meat. Dairy Low-fat (1%) or fat-free (skim) milk. Fat-free, low-fat, or reduced-fat cheeses. Nonfat, low-sodium ricotta or cottage cheese. Low-fat or nonfat yogurt. Low-fat, low-sodium cheese. Fats and oils Soft margarine without trans fats. Vegetable oil. Low-fat, reduced-fat, or light mayonnaise and salad dressings (reduced-sodium). Canola, safflower, olive, soybean, and sunflower oils. Avocado. Seasoning and other foods Herbs. Spices. Seasoning mixes without salt. Unsalted popcorn and pretzels. Fat-free sweets. What foods are not recommended? The items listed may not be a complete list. Talk with your dietitian about what dietary choices are best for you. Grains Baked goods made with fat, such as croissants, muffins, or some breads. Dry pasta or rice meal packs. Vegetables Creamed or fried vegetables. Vegetables in a cheese sauce. Regular canned vegetables (not low-sodium or reduced-sodium). Regular canned tomato sauce and paste (not low-sodium or reduced-sodium). Regular tomato and vegetable juice (not low-sodium or reduced-sodium). Angie Fava. Olives. Fruits Canned fruit in a light or heavy syrup. Fried fruit. Fruit in cream or butter sauce. Meat and other protein foods Fatty cuts of meat. Ribs. Fried meat. Berniece Salines. Sausage. Bologna and other processed lunch meats. Salami. Fatback. Hotdogs. Bratwurst. Salted nuts and  seeds. Canned beans with added salt. Canned or smoked fish. Whole eggs or egg yolks. Chicken or [place] with skin. Dairy Whole or 2% milk, cream, and half-and-half. Whole or full-fat cream cheese. Whole-fat or sweetened yogurt. Full-fat cheese. Nondairy creamers. Whipped toppings. Processed cheese and cheese spreads. Fats and oils Butter. Stick margarine. Lard. Shortening. Ghee. Bacon fat. Tropical oils, such as coconut, palm kernel, or palm oil. Seasoning and other foods Salted popcorn and pretzels. Onion salt, garlic salt, seasoned salt, table salt, and sea salt. Worcestershire sauce. Tartar sauce. Barbecue sauce. Teriyaki sauce. Soy sauce, including reduced-sodium. Steak sauce. Canned and packaged gravies. Fish sauce. Oyster sauce. Cocktail sauce. Horseradish that you find on the shelf. Ketchup. Mustard. Meat flavorings and tenderizers. Bouillon cubes. Hot sauce and Tabasco sauce. Premade or packaged marinades. Premade or packaged taco seasonings. Relishes. Regular salad dressings. Where to find more information:  National Heart, Lung, and [place]: https://wilson-eaton.com/  American Heart Association: www.heart.org Summary  The DASH eating plan is a healthy eating plan that has been shown to reduce high blood pressure (hypertension). It may also reduce your risk for type 2 diabetes, heart disease, and stroke.  With the DASH eating plan, you should limit salt (sodium) intake to 2,300 mg a day. If you have hypertension, you may need to reduce your  sodium intake to 1,500 mg a day.  When on the DASH eating plan, aim to eat more fresh fruits and vegetables, whole grains, lean proteins, low-fat dairy, and heart-healthy fats.  Work with your health care provider or diet and nutrition specialist (dietitian) to adjust your eating plan to your individual calorie needs. This information is not intended to replace advice given to you by your health care provider. Make sure you discuss any questions you  have with your health care provider. Document Released: 05/08/2011 Document Revised: 05/12/2016 Document Reviewed: 05/12/2016 Elsevier Interactive Patient Education  2017 [employer]

## 2016-12-22 NOTE — Progress Notes (Signed)
HPI:                                                                Earl LitesDavid Vasseur is a 51 y.o. male [facility] today to establish care  He is also followed by [facility]igen[facility] [place]  Current Concerns include hypertension  HTN: taking Lisinopril 20-25mg  daily. Compliant with medications. Ran out of his medication on Friday. Checks BP's at home. Reports 120's/80's. Reports he exercises regularly and is able to run 3 miles without chest pain or DOE. Denies vision change, headache, chest pain with exertion, orthopnea, lightheadedness, syncope and edema. Risk factors include: male sex, overweight  Health Maintenance Health Maintenance  Topic Date Due  . Hepatitis C Screening  1965-07-27  . HIV Screening  02/28/1979  . TETANUS/TDAP  02/28/1983  . COLONOSCOPY  02/27/2014  . INFLUENZA VACCINE  12/31/2016    Past Medical History:  Diagnosis Date  . GERD (gastroesophageal reflux disease)   . Hypertension    History reviewed. No pertinent surgical history. Social History  Substance Use Topics  . Smoking status: Never Smoker  . Smokeless tobacco: Never Used  . Alcohol use No   family history includes Hypertension in his father and mother.  ROS: Review of Systems  Constitutional: Negative.   HENT: Negative.   Respiratory: Negative.   Cardiovascular: Negative.   Gastrointestinal: Negative.   Genitourinary: Negative.   Musculoskeletal: Positive for back pain.  Skin: Negative.   Neurological: Negative.   Endo/Heme/Allergies: Negative.   Psychiatric/Behavioral: Negative.      Medications: Current Outpatient Prescriptions  Medication Sig Dispense Refill  . cholecalciferol (VITAMIN D) 1000 units tablet Take 1,000 Units by mouth daily.    Marland Kitchen. DHEA 10 MG CAPS Take by mouth.    . Testosterone 20 % CREA by Does not apply route 2 (two) times daily.    Marland Kitchen. VITAMIN K PO Take by mouth.    .  cetirizine (ZYRTEC) 10 MG tablet Take 10 mg by mouth daily.    Marland Kitchen. lisinopril-hydrochlorothiazide (PRINZIDE,ZESTORETIC) 20-25 MG tablet TAKE ONE TABLET BY MOUTH EVERY DAY. 90 tablet 0   No current facility-administered medications for this visit.    Allergies  Allergen Reactions  . Penicillins     Objective:  BP (!) 187/95 (BP Location: Left Arm, Patient Position: Sitting, Cuff Size: Normal)   Pulse 60   Temp 98.4 F (36.9 C) (Oral)   Wt 201 lb (91.2 kg)   SpO2 96%   BMI 28.84 kg/m  Gen: well-groomed, cooperative, not ill-appearing, no distress HEENT: normal conjunctiva, TM's clear, oropharynx clear, moist mucus membranes, no thyromegaly or tenderness Pulm: Normal work of breathing, normal phonation, clear to auscultation bilaterally CV: Normal rate, regular rhythm, s1 and s2 distinct, no murmurs, clicks or rubs, no carotid bruit GI: abdomen soft, nondistended, nontender, no masses Neuro: alert and oriented x 3, EOM's intact, PERRLA, DTR's intact, normal tone, no tremor MSK: moving all extremities, normal gait and station, no peripheral edema Skin: warm and dry, no rashes or lesions on exposed skin Psych: normal affect, euthymic mood, normal speech and thought content   No results found for this or any previous visit (from the past 72 hour(s)). No  results found.  Depression screen PHQ 2/9 12/22/2016  Decreased Interest 0  Down, Depressed, Hopeless 0  PHQ - 2 Score 0     Assessment and Plan: 51 y.o. male with   1. Encounter to establish care - reviewed PMH - negative PHQ2  2. Uncontrolled stage 2 hypertension BP Readings from Last 3 Encounters:  12/22/16 (!) 187/95  12/16/16 116/76  11/18/16 (!) 150/96  - patient is known to be out of his medication. BP was at goal last week while taking medication - patient to monitor his BP's at home - Goal BP <130/80 - therapeutic lifestyle changes including DASH eating plan - lisinopril-hydrochlorothiazide (PRINZIDE,ZESTORETIC)  20-25 MG tablet; TAKE ONE TABLET BY MOUTH EVERY DAY.  Dispense: 90 tablet; Refill: 0  No orders of the defined types were placed in this encounter.  Patient education and anticipatory guidance given Patient agrees with treatment plan Follow-up in 2 weeks for nurse BP check or sooner as needed  Levonne Hubert PA-C

## 2017-01-13 ENCOUNTER — Ambulatory Visit (INDEPENDENT_AMBULATORY_CARE_PROVIDER_SITE_OTHER): Payer: BLUE CROSS/BLUE SHIELD | Admitting: Sports Medicine

## 2017-01-13 ENCOUNTER — Encounter: Payer: Self-pay | Admitting: Sports Medicine

## 2017-01-13 DIAGNOSIS — M5416 Radiculopathy, lumbar region: Secondary | ICD-10-CM | POA: Diagnosis not present

## 2017-01-13 DIAGNOSIS — M5412 Radiculopathy, cervical region: Secondary | ICD-10-CM

## 2017-01-13 NOTE — Progress Notes (Signed)
  Subjective:    CC: Follow-up  HPI: Cervical radiculitis: Improving considerably.  Lumbar radiculitis: Really didn't get much better after prednisone, would like to repeat his interlaminar epidural.  Past medical history:  Negative.  See flowsheet/record as well for more information.  Surgical history: Negative.  See flowsheet/record as well for more information.  Family history: Negative.  See flowsheet/record as well for more information.  Social history: Negative.  See flowsheet/record as well for more information.  Allergies, and medications have been entered into the medical record, reviewed, and no changes needed.   Review of Systems: No fevers, chills, night sweats, weight loss, chest pain, or shortness of breath.   Objective:    General: Well Developed, well nourished, and in no acute distress.  Neuro: Alert and oriented x3, extra-ocular muscles intact, sensation grossly intact.  HEENT: Normocephalic, atraumatic, pupils equal round reactive to light, neck supple, no masses, no lymphadenopathy, thyroid nonpalpable.  Skin: Warm and dry, no rashes. Cardiac: Regular rate and rhythm, no murmurs rubs or gallops, no lower extremity edema.  Respiratory: Clear to auscultation bilaterally. Not using accessory muscles, speaking in full sentences.  Impression and Recommendations:    Left lumbar radiculopathy Repeat left L3-L4 interlaminar epidural. He did not get much relief regarding his low back with the prednisone. Previous epidural was over a year and a half ago. Return as needed for this.  Radiculitis of right cervical region Continues to improve. If not fully gone in a month we will proceed with MRI and epidural.  I spent 25 minutes with this patient, greater than 50% was face-to-face time counseling regarding the above diagnoses

## 2017-01-13 NOTE — Assessment & Plan Note (Signed)
Continues to improve. If not fully gone in a month we will proceed with MRI and epidural.

## 2017-01-13 NOTE — Assessment & Plan Note (Signed)
Repeat left L3-L4 interlaminar epidural. He did not get much relief regarding his low back with the prednisone. Previous epidural was over a year and a half ago. Return as needed for this.

## 2017-01-28 ENCOUNTER — Ambulatory Visit
Admission: RE | Admit: 2017-01-28 | Discharge: 2017-01-28 | Disposition: A | Discharge status: Home / Self Care | Payer: BLUE CROSS/BLUE SHIELD | Source: Ambulatory Visit | Attending: Sports Medicine | Admitting: Sports Medicine

## 2017-01-28 DIAGNOSIS — M5136 Other intervertebral disc degeneration, lumbar region: Secondary | ICD-10-CM | POA: Diagnosis not present

## 2017-01-28 MED ORDER — IOPAMIDOL (ISOVUE-M 200) INJECTION 41%
1.0000 mL | Freq: Once | INTRAMUSCULAR | Status: AC
  Administered 2017-01-28: 1 mL via EPIDURAL

## 2017-01-28 MED ORDER — METHYLPREDNISOLONE ACETATE 40 MG/ML INJ SUSP (RADIOLOG
120.0000 mg | Freq: Once | INTRAMUSCULAR | Status: AC
  Administered 2017-01-28: 120 mg via EPIDURAL

## 2017-01-28 NOTE — Discharge Instructions (Signed)

## 2017-02-10 ENCOUNTER — Ambulatory Visit: Payer: BLUE CROSS/BLUE SHIELD | Admitting: Sports Medicine

## 2017-02-17 DIAGNOSIS — E559 Vitamin D deficiency, unspecified: Secondary | ICD-10-CM | POA: Diagnosis not present

## 2017-02-17 DIAGNOSIS — E291 Testicular hypofunction: Secondary | ICD-10-CM | POA: Diagnosis not present

## 2017-02-17 DIAGNOSIS — I10 Essential (primary) hypertension: Secondary | ICD-10-CM | POA: Diagnosis not present

## 2017-02-17 LAB — CBC AND DIFFERENTIAL
HCT: 46 (ref 41–53)
HEMOGLOBIN: 16 (ref 13.5–17.5)
Platelets: 186 (ref 150–399)
WBC: 5

## 2017-03-26 ENCOUNTER — Other Ambulatory Visit: Payer: Self-pay | Admitting: Sports Medicine

## 2017-03-26 DIAGNOSIS — I10 Essential (primary) hypertension: Secondary | ICD-10-CM

## 2017-06-29 ENCOUNTER — Other Ambulatory Visit: Payer: Self-pay | Admitting: Sports Medicine

## 2017-07-01 ENCOUNTER — Other Ambulatory Visit: Payer: Self-pay | Admitting: Sports Medicine

## 2017-07-01 DIAGNOSIS — I10 Essential (primary) hypertension: Secondary | ICD-10-CM

## 2017-08-03 ENCOUNTER — Telehealth: Payer: Self-pay | Admitting: Sports Medicine

## 2017-08-03 DIAGNOSIS — M5416 Radiculopathy, lumbar region: Secondary | ICD-10-CM

## 2017-08-03 NOTE — Telephone Encounter (Signed)
Pt called and is wanting to get set up for another back injection at [facility]. Thanks

## 2017-08-03 NOTE — Telephone Encounter (Signed)
Orders placed.  Please contact [facility] for scheduling

## 2017-08-04 NOTE — Telephone Encounter (Signed)
Left VM updating Pt of order and GI phone number. GI notified.

## 2017-08-17 ENCOUNTER — Encounter: Payer: Self-pay | Admitting: Sports Medicine

## 2017-08-17 ENCOUNTER — Ambulatory Visit (INDEPENDENT_AMBULATORY_CARE_PROVIDER_SITE_OTHER): Payer: BLUE CROSS/BLUE SHIELD | Admitting: Sports Medicine

## 2017-08-17 DIAGNOSIS — M5416 Radiculopathy, lumbar region: Secondary | ICD-10-CM

## 2017-08-17 NOTE — Progress Notes (Signed)
  Subjective:    CC: Radiculopathy  HPI: Mark Romero is a very pleasant 52 year old male, for some time now he has had mild back pain with radiation around the left anterior thigh, moderate, persistent.  No bowel or bladder dysfunction, saddle numbness, no constitutional symptoms.  He has had several epidurals, most of which did provide good relief, his last epidural was last summer.  He is agreeable to proceed with another injection.  I reviewed the past medical history, family history, social history, surgical history, and allergies today and no changes were needed.  Please see the problem list section below in epic for further details.  Past Medical History: Past Medical History:  Diagnosis Date  . GERD (gastroesophageal reflux disease)   . Hypertension    Past Surgical History: No past surgical history on file. Social History: Social History   Socioeconomic History  . Marital status: Married    Spouse name: None  . Number of children: None  . Years of education: None  . Highest education level: None  Social Needs  . Financial resource strain: None  . Food insecurity - worry: None  . Food insecurity - inability: None  . Transportation needs - medical: None  . Transportation needs - non-medical: None  Occupational History  . None  Tobacco Use  . Smoking status: Never Smoker  . Smokeless tobacco: Never Used  Substance and Sexual Activity  . Alcohol use: No  . Drug use: No  . Sexual activity: Yes    Birth control/protection: None  Other Topics Concern  . None  Social History Narrative  . None   Family History: Family History  Problem Relation Age of Onset  . Hypertension Mother   . Hypertension Father    Allergies: Allergies  Allergen Reactions  . Penicillins    Medications: See med rec.  Review of Systems: No fevers, chills, night sweats, weight loss, chest pain, or shortness of breath.   Objective:    General: Well Developed, well nourished, and in no acute  distress.  Neuro: Alert and oriented x3, extra-ocular muscles intact, sensation grossly intact.  HEENT: Normocephalic, atraumatic, pupils equal round reactive to light, neck supple, no masses, no lymphadenopathy, thyroid nonpalpable.  Skin: Warm and dry, no rashes. Cardiac: Regular rate and rhythm, no murmurs rubs or gallops, no lower extremity edema.  Respiratory: Clear to auscultation bilaterally. Not using accessory muscles, speaking in full sentences. Back Exam:  Inspection: Unremarkable  Motion: Flexion 45 deg, Extension 45 deg, Side Bending to 45 deg bilaterally,  Rotation to 45 deg bilaterally  SLR laying: Negative  XSLR laying: Negative  Palpable tenderness: None. FABER: negative. Sensory change: Gross sensation intact to all lumbar and sacral dermatomes.  Reflexes: 2+ at both patellar tendons, 2+ at achilles tendons, Babinski's downgoing.  Strength at foot  Plantar-flexion: 5/5 Dorsi-flexion: 5/5 Eversion: 5/5 Inversion: 5/5  Leg strength  Quad: 5/5 Hamstring: 5/5 Hip flexor: 5/5 Hip abductors: 5/5  Gait unremarkable.  Impression and Recommendations:    Left lumbar radiculopathy Previous epidurals have done well for the most part. Last one was last summer, repeating a left L3-L4 interlaminar epidural at this time with Dr. Jolaine ClickArthur Hoss. We discussed a treatment plan, return to see me 1 month after the injection to evaluate relief, we did discuss stacking 3 injections over 3 months. ___________________________________________ Ihor Austinhomas J. Benjamin Stainhekkekandam, M.D., ABFM., CAQSM. Primary Care and Sports Medicine [facility]  Adjunct Instructor of Family Medicine  [employer]

## 2017-08-17 NOTE — Assessment & Plan Note (Signed)
Previous epidurals have done well for the most part. Last one was last summer, repeating a left L3-L4 interlaminar epidural at this time with Dr. Jolaine ClickArthur Hoss. We discussed a treatment plan, return to see me 1 month after the injection to evaluate relief, we did discuss stacking 3 injections over 3 months.

## 2017-08-19 ENCOUNTER — Ambulatory Visit
Admission: RE | Admit: 2017-08-19 | Discharge: 2017-08-19 | Disposition: A | Discharge status: Home / Self Care | Payer: BLUE CROSS/BLUE SHIELD | Source: Ambulatory Visit | Attending: Sports Medicine | Admitting: Sports Medicine

## 2017-08-19 DIAGNOSIS — M47817 Spondylosis without myelopathy or radiculopathy, lumbosacral region: Secondary | ICD-10-CM | POA: Diagnosis not present

## 2017-08-19 MED ORDER — IOPAMIDOL (ISOVUE-M 200) INJECTION 41%
1.0000 mL | Freq: Once | INTRAMUSCULAR | Status: AC
  Administered 2017-08-19: 1 mL via EPIDURAL

## 2017-08-19 MED ORDER — METHYLPREDNISOLONE ACETATE 40 MG/ML INJ SUSP (RADIOLOG
120.0000 mg | Freq: Once | INTRAMUSCULAR | Status: AC
  Administered 2017-08-19: 120 mg via EPIDURAL

## 2017-08-19 NOTE — Discharge Instructions (Signed)

## 2017-09-01 ENCOUNTER — Encounter: Payer: Self-pay | Admitting: Sports Medicine

## 2017-09-01 ENCOUNTER — Ambulatory Visit (INDEPENDENT_AMBULATORY_CARE_PROVIDER_SITE_OTHER): Payer: BLUE CROSS/BLUE SHIELD | Admitting: Sports Medicine

## 2017-09-01 DIAGNOSIS — M5416 Radiculopathy, lumbar region: Secondary | ICD-10-CM

## 2017-09-01 DIAGNOSIS — M4807 Spinal stenosis, lumbosacral region: Secondary | ICD-10-CM

## 2017-09-01 MED ORDER — GABAPENTIN 300 MG PO CAPS: ORAL_CAPSULE | ORAL | 3 refills | Status: DC

## 2017-09-01 NOTE — Progress Notes (Signed)
Subjective:    CC: Follow-up  HPI: Mark HuaDavid returns, he is a pleasant 52 year old male with low back pain, radiation down the left anterior thigh, L3 distribution.  He has had for epidurals at this point after failing aggressive formal physical therapy, each 1 has provided only minimal relief, but now has a moderate recurrence in pain.  He recently had an epidural about 2 weeks ago, only minimal pain relief this time.  I reviewed the past medical history, family history, social history, surgical history, and allergies today and no changes were needed.  Please see the problem list section below in epic for further details.  Past Medical History: Past Medical History:  Diagnosis Date  . GERD (gastroesophageal reflux disease)   . Hypertension    Past Surgical History: No past surgical history on file. Social History: Social History   Socioeconomic History  . Marital status: Married    Spouse name: Not on file  . Number of children: Not on file  . Years of education: Not on file  . Highest education level: Not on file  Occupational History  . Not on file  Social Needs  . Financial resource strain: Not on file  . Food insecurity:    Worry: Not on file    Inability: Not on file  . Transportation needs:    Medical: Not on file    Non-medical: Not on file  Tobacco Use  . Smoking status: Never Smoker  . Smokeless tobacco: Never Used  Substance and Sexual Activity  . Alcohol use: No  . Drug use: No  . Sexual activity: Yes    Birth control/protection: None  Lifestyle  . Physical activity:    Days per week: Not on file    Minutes per session: Not on file  . Stress: Not on file  Relationships  . Social connections:    Talks on phone: Not on file    Gets together: Not on file    Attends religious service: Not on file    Active member of club or organization: Not on file    Attends meetings of clubs or organizations: Not on file    Relationship status: Not on file  Other  Topics Concern  . Not on file  Social History Narrative  . Not on file   Family History: Family History  Problem Relation Age of Onset  . Hypertension Mother   . Hypertension Father    Allergies: Allergies  Allergen Reactions  . Penicillins    Medications: See med rec.  Review of Systems: No fevers, chills, night sweats, weight loss, chest pain, or shortness of breath.   Objective:    General: Well Developed, well nourished, and in no acute distress.  Neuro: Alert and oriented x3, extra-ocular muscles intact, sensation grossly intact.  HEENT: Normocephalic, atraumatic, pupils equal round reactive to light, neck supple, no masses, no lymphadenopathy, thyroid nonpalpable.  Skin: Warm and dry, no rashes. Cardiac: Regular rate and rhythm, no murmurs rubs or gallops, no lower extremity edema.  Respiratory: Clear to auscultation bilaterally. Not using accessory muscles, speaking in full sentences.  Impression and Recommendations:    Left lumbar radiculopathy At this point has had for left L3-L4 interlaminar epidurals, minimal response from the initial ones, more recently no improvement. I am going to repeat his MRI, the previous one is about 54 years old, and I would like him to touch base with Dr. Yevette Edwardsumonski. He does have axial back pain with left sided L3 distribution radicular pain.  Starting gabapentin as well.  I spent 25 minutes with this patient, greater than 50% was face-to-face time counseling regarding the above diagnoses ___________________________________________ Ihor Austin. Benjamin Stain, M.D., ABFM., CAQSM. Primary Care and Sports Medicine [facility]  Adjunct Instructor of Family Medicine  [employer]

## 2017-09-01 NOTE — Assessment & Plan Note (Signed)
At this point has had for left L3-L4 interlaminar epidurals, minimal response from the initial ones, more recently no improvement. I am going to repeat his MRI, the previous one is about 52 years old, and I would like him to touch base with Dr. Yevette Edwardsumonski. He does have axial back pain with left sided L3 distribution radicular pain. Starting gabapentin as well.

## 2017-09-07 ENCOUNTER — Ambulatory Visit (INDEPENDENT_AMBULATORY_CARE_PROVIDER_SITE_OTHER): Payer: BLUE CROSS/BLUE SHIELD

## 2017-09-07 DIAGNOSIS — M5116 Intervertebral disc disorders with radiculopathy, lumbar region: Secondary | ICD-10-CM | POA: Diagnosis not present

## 2017-09-07 DIAGNOSIS — M4316 Spondylolisthesis, lumbar region: Secondary | ICD-10-CM

## 2017-09-07 DIAGNOSIS — M47816 Spondylosis without myelopathy or radiculopathy, lumbar region: Secondary | ICD-10-CM | POA: Diagnosis not present

## 2017-09-07 DIAGNOSIS — M4807 Spinal stenosis, lumbosacral region: Secondary | ICD-10-CM

## 2017-09-07 DIAGNOSIS — M5416 Radiculopathy, lumbar region: Secondary | ICD-10-CM

## 2017-09-11 DIAGNOSIS — M5416 Radiculopathy, lumbar region: Secondary | ICD-10-CM | POA: Diagnosis not present

## 2017-09-17 ENCOUNTER — Ambulatory Visit: Payer: BLUE CROSS/BLUE SHIELD | Admitting: Sports Medicine

## 2017-09-29 ENCOUNTER — Ambulatory Visit: Payer: BLUE CROSS/BLUE SHIELD | Admitting: Sports Medicine

## 2017-10-06 ENCOUNTER — Other Ambulatory Visit: Payer: Self-pay | Admitting: Sports Medicine

## 2017-10-06 DIAGNOSIS — I10 Essential (primary) hypertension: Secondary | ICD-10-CM

## 2017-10-19 ENCOUNTER — Other Ambulatory Visit: Payer: Self-pay

## 2017-10-19 ENCOUNTER — Emergency Department (INDEPENDENT_AMBULATORY_CARE_PROVIDER_SITE_OTHER)
Admission: EM | Admit: 2017-10-19 | Discharge: 2017-10-19 | Disposition: A | Discharge status: Home / Self Care | Payer: BLUE CROSS/BLUE SHIELD | Source: Home / Self Care | Attending: Family Medicine | Admitting: Family Medicine

## 2017-10-19 ENCOUNTER — Encounter: Payer: Self-pay | Admitting: *Deleted

## 2017-10-19 DIAGNOSIS — J069 Acute upper respiratory infection, unspecified: Secondary | ICD-10-CM | POA: Diagnosis not present

## 2017-10-19 DIAGNOSIS — B9789 Other viral agents as the cause of diseases classified elsewhere: Secondary | ICD-10-CM | POA: Diagnosis not present

## 2017-10-19 MED ORDER — DOXYCYCLINE HYCLATE 100 MG PO CAPS: 100.0000 mg | ORAL_CAPSULE | Freq: Two times a day (BID) | ORAL | 0 refills | Status: DC

## 2017-10-19 MED ORDER — BENZONATATE 200 MG PO CAPS: ORAL_CAPSULE | ORAL | 0 refills | Status: DC

## 2017-10-19 MED ORDER — PREDNISONE 20 MG PO TABS: ORAL_TABLET | ORAL | 0 refills | Status: DC

## 2017-10-19 NOTE — Discharge Instructions (Addendum)
Take plain guaifenesin (  extended release tabs such as Mucinex) twice daily, with plenty of water, for cough and congestion.  May add Pseudoephedrine ( , one or two every 4 to 6 hours) for sinus congestion.  Get adequate rest.   May use Afrin nasal spray (or generic oxymetazoline) each morning for about 5 days and then discontinue.  Also recommend using saline nasal spray several times daily and saline nasal irrigation (AYR is a common brand).  Use Flonase nasal spray each morning after using Afrin nasal spray and saline nasal irrigation. Try warm salt water gargles for sore throat.  Stop all antihistamines for now, and other non-prescription cough/cold preparations. May take Delsym Cough Suppressant with Tessalon (benzonatate) at bedtime for nighttime cough.

## 2017-10-19 NOTE — ED Provider Notes (Signed)
[name]    CSN: [id number] Arrival date & time: 10/19/17  1544     History   Chief Complaint Chief Complaint  Patient presents with  . Nasal Congestion  . Cough    HPI Mark Romero is a 52 y.o. male.   Patient complains of approximately 10 day history of typical cold-like symptoms developing over several days, including mild sore throat, sinus congestion, headache, fatigue, and cough.  His sinus congestion has persisted and he now feels worse with increased fatigue.  He has seasonal rhinitis and a past history of asthma as a child.  The history is provided by the patient.    Past Medical History:  Diagnosis Date  . GERD (gastroesophageal reflux disease)   . Hypertension     Patient Active Problem List   Diagnosis Date Noted  . Uncontrolled stage 2 hypertension 12/22/2016  . Radiculitis of right cervical region 11/18/2016  . Left lumbar radiculopathy 02/26/2015  . Essential hypertension, benign 02/26/2015  . Anaphylaxis 11/28/2011    History reviewed. No pertinent surgical history.     Home Medications    Prior to Admission medications   Medication Sig Start Date End Date Taking? Authorizing Provider  lisinopril-hydrochlorothiazide (PRINZIDE,ZESTORETIC) 20-25 MG tablet Take 1 tablet by mouth daily. NEEDS APPT WITH PCP FOR FURTHER REFILLS 10/06/17  Yes Carlis Stable, PA-C  benzonatate (TESSALON) 200 MG capsule Take one cap by mouth at bedtime as needed for cough.  May repeat in 4 to 6 hours 10/19/17   Lattie Haw, MD  cetirizine (ZYRTEC) 10 MG tablet Take 10 mg by mouth daily.    [provider]  cholecalciferol (VITAMIN D) 1000 units tablet Take 1,000 Units by mouth daily.    [provider]  DHEA 10 MG CAPS Take by mouth.    [provider]  doxycycline (VIBRAMYCIN) 100 MG capsule Take 1 capsule (100 mg total) by mouth 2 (two) times daily. Take with food. 10/19/17   Lattie Haw, MD  gabapentin  (NEURONTIN) 300 MG capsule One tab PO qHS for a week, then BID for a week, then TID. May double weekly to a max of 3,600mg /day 09/01/17   Monica Becton, MD  meloxicam (MOBIC) 15 MG tablet TAKE ONE TABLET BY MOUTH EVERY MORNING WITH BREAKFAST FOR 2 WEEKS, THEN DAILY AS NEEDED FOR PAIN 06/29/17   Monica Becton, MD  predniSONE (DELTASONE) 20 MG tablet Take one tab by mouth twice daily for 4 days, then one daily. Take with food. 10/19/17   Lattie Haw, MD  Testosterone 20 % CREA by Does not apply route 2 (two) times daily.    [provider]  VITAMIN K PO Take by mouth.    [provider]    Family History Family History  Problem Relation Age of Onset  . Hypertension Mother   . Hypertension Father     Social History Social History   Tobacco Use  . Smoking status: Never Smoker  . Smokeless tobacco: Never Used  Substance Use Topics  . Alcohol use: Yes    Comment: 2 q wk  . Drug use: No     Allergies   Penicillins   Review of Systems Review of Systems + sore throat + cough No pleuritic pain No wheezing + nasal congestion + post-nasal drainage + sinus pain/pressure No itchy/red eyes No earache No hemoptysis No SOB No fever, + chills No nausea No vomiting No abdominal pain No diarrhea No urinary symptoms  No skin rash + fatigue + myalgias initially No headache Used OTC meds without relief   Physical Exam Triage Vital Signs ED Triage Vitals  Enc Vitals Group     BP 10/19/17 1608 130/82     Pulse Rate 10/19/17 1608 71     Resp 10/19/17 1608 16     Temp 10/19/17 1608 98.6 F (37 C)     Temp Source 10/19/17 1608 Oral     SpO2 10/19/17 1608 98 %     Weight 10/19/17 1609 193 lb (87.5 kg)     Height 10/19/17 1609  (1.778 m)     Head Circumference --      Peak Flow --      Pain Score 10/19/17 1609 0     Pain Loc --      Pain Edu? --      Excl. in GC? --    No data found.  Updated Vital Signs BP 130/82 (BP  Location: Right Arm)   Pulse 71   Temp 98.6 F (37 C) (Oral)   Resp 16   Ht  (1.778 m)   Wt 193 lb (87.5 kg)   SpO2 98%   BMI 27.69 kg/m   Visual Acuity Right Eye Distance:   Left Eye Distance:   Bilateral Distance:    Right Eye Near:   Left Eye Near:    Bilateral Near:     Physical Exam Nursing notes and Vital Signs reviewed. Appearance:  Patient appears stated age, and in no acute distress Eyes:  Pupils are equal, round, and reactive to light and accomodation.  Extraocular movement is intact.  Conjunctivae are not inflamed  Ears:  Canals normal.  Tympanic membranes normal.  Nose:  Mildly congested turbinates.    Maxillary sinus tenderness is present.  Pharynx:  Normal Neck:  Supple.  Enlarged posterior/lateral nodes are palpated bilaterally. Lungs:  Clear to auscultation.  Breath sounds are equal.  Moving air well. Heart:  Regular rate and rhythm without murmurs, rubs, or gallops.  Abdomen:  Nontender without masses or hepatosplenomegaly.  Bowel sounds are present.  No CVA or flank tenderness.  Extremities:  No edema.  Skin:  No rash present.    UC Treatments / Results  Labs (all labs ordered are listed, but only abnormal results are displayed) Labs Reviewed - No data to display  EKG None  Radiology No results found.  Procedures Procedures (including critical care time)  Medications Ordered in UC Medications - No data to display  Initial Impression / Assessment and Plan / UC Course  I have reviewed the triage vital signs and the nursing notes.  Pertinent labs & imaging results that were available during my care of the patient were reviewed by me and considered in my medical decision making (see chart for details).    Because of his past history of reactive airways disease, will begin prednisone burst/taper. Begin empiric doxycycline. Prescription written for Benzonatate [facility]) to take at bedtime for night-time cough.  Followup with Family Doctor  if not improved in about 7 to 10 days.   Final Clinical Impressions(s) / UC Diagnoses   Final diagnoses:  Viral URI with cough     Discharge Instructions     Take plain guaifenesin (  extended release tabs such as Mucinex) twice daily, with plenty of water, for cough and congestion.  May add Pseudoephedrine ( , one or two every 4 to 6 hours) for sinus congestion.  Get adequate rest.   May  use Afrin nasal spray (or generic oxymetazoline) each morning for about 5 days and then discontinue.  Also recommend using saline nasal spray several times daily and saline nasal irrigation (AYR is a common brand).  Use Flonase nasal spray each morning after using Afrin nasal spray and saline nasal irrigation. Try warm salt water gargles for sore throat.  Stop all antihistamines for now, and other non-prescription cough/cold preparations. May take Delsym Cough Suppressant with Tessalon (benzonatate) at bedtime for nighttime cough.       ED Prescriptions    Medication Sig Dispense Auth. Provider   doxycycline (VIBRAMYCIN) 100 MG capsule Take 1 capsule (100 mg total) by mouth 2 (two) times daily. Take with food. 14 capsule Lattie Haw, MD   predniSONE (DELTASONE) 20 MG tablet Take one tab by mouth twice daily for 4 days, then one daily. Take with food. 12 tablet Lattie Haw, MD   benzonatate (TESSALON) 200 MG capsule Take one cap by mouth at bedtime as needed for cough.  May repeat in 4 to 6 hours 15 capsule Cathren Harsh Tera Mater, MD         Lattie Haw, MD 10/19/17 [phone]

## 2017-10-19 NOTE — ED Triage Notes (Signed)
Pt c/o nasal congestion and productive cough x 1 wk. Denies fever. He has taken Mucinex and decongestants with minimal relief.

## 2017-11-17 ENCOUNTER — Other Ambulatory Visit: Payer: Self-pay | Admitting: Physician Assistant

## 2017-11-17 DIAGNOSIS — I10 Essential (primary) hypertension: Secondary | ICD-10-CM

## 2017-12-22 ENCOUNTER — Ambulatory Visit: Payer: BLUE CROSS/BLUE SHIELD | Admitting: Physician Assistant

## 2017-12-25 ENCOUNTER — Telehealth: Payer: Self-pay | Admitting: Physician Assistant

## 2017-12-25 DIAGNOSIS — I10 Essential (primary) hypertension: Secondary | ICD-10-CM

## 2017-12-25 MED ORDER — LISINOPRIL-HYDROCHLOROTHIAZIDE 20-25 MG PO TABS: 1.0000 | ORAL_TABLET | Freq: Every day | ORAL | 0 refills | Status: DC

## 2017-12-25 NOTE — Telephone Encounter (Signed)
A 12 day supply was given to patient. This will cover patient until his appointment on 8/7. -EH/RMA

## 2017-12-25 NOTE — Telephone Encounter (Signed)
Pt called.  He is requesting refill on his Lisinopril -  He's been off med for about 3 wks now..  Due to his work schedule it has made it difficult for him to come in.  He has made an appointment to come in  August 7th

## 2018-01-06 ENCOUNTER — Encounter: Payer: Self-pay | Admitting: Physician Assistant

## 2018-01-06 ENCOUNTER — Ambulatory Visit (INDEPENDENT_AMBULATORY_CARE_PROVIDER_SITE_OTHER): Payer: BLUE CROSS/BLUE SHIELD | Admitting: Physician Assistant

## 2018-01-06 VITALS — BP 135/90 | HR 69 | Wt 193.0 lb

## 2018-01-06 DIAGNOSIS — Z1211 Encounter for screening for malignant neoplasm of colon: Secondary | ICD-10-CM | POA: Diagnosis not present

## 2018-01-06 DIAGNOSIS — I10 Essential (primary) hypertension: Secondary | ICD-10-CM

## 2018-01-06 DIAGNOSIS — Z5181 Encounter for therapeutic drug level monitoring: Secondary | ICD-10-CM | POA: Insufficient documentation

## 2018-01-06 MED ORDER — LISINOPRIL-HYDROCHLOROTHIAZIDE 20-25 MG PO TABS: 1.0000 | ORAL_TABLET | Freq: Every day | ORAL | 0 refills | Status: DC

## 2018-01-06 NOTE — Patient Instructions (Addendum)
If you can locate your lab results from your life insurance policy and drop off a copy or fax them to [phone]  For your blood pressure: - Goal <130/80 - baby aspirin 81 mg daily to help prevent heart attack/stroke - monitor and log blood pressures at home - check around the same time each day in a relaxed setting - Limit salt to <2000 mg/day - Follow DASH eating plan - limit alcohol to 2 standard drinks per day for men and 1 per day for women - avoid tobacco products - weight loss: 7% of current body weight - follow-up every 6 months for your blood pressure

## 2018-01-06 NOTE — Progress Notes (Signed)
HPI:                                                                Mark Romero is a 52 y.o. male who presents to [facility]: Primary Care Sports Medicine today for hypertension follow-up  HTN: taking Lisinopril 20-25mg  daily. Compliant with medications. Checks BP's at home. Reports 120's-130's/80's-90's. Reports he exercises regularly (elliptical and weight training) without chest pain or DOE. Unable to run due to back pain. Denies vision change, headache, chest pain with exertion, orthopnea, lightheadedness, syncope and edema. 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 Last episode was 7 years ago. He reports negative allergy work-up and he has been well-managed with Zyrtec daily.   Depression screen [facility] 2/9 12/22/2016  Decreased Interest 0  Down, Depressed, Hopeless 0  PHQ - 2 Score 0    No flowsheet data found.    Past Medical History:  Diagnosis Date  . GERD (gastroesophageal reflux disease)   . Hypertension    No past surgical history on file. Social History   Tobacco Use  . Smoking status: Never Smoker  . Smokeless tobacco: Never Used  Substance Use Topics  . Alcohol use: Yes    Comment: 2 q wk   family history includes Hypertension in his father and mother.    ROS: negative except as noted in the HPI  Medications: Current Outpatient Medications  Medication Sig Dispense Refill  . cetirizine (ZYRTEC) 10 MG tablet Take 10 mg by mouth daily.    . cholecalciferol (VITAMIN D) 1000 units tablet Take 1,000 Units by mouth daily.    Marland Kitchen DHEA 10 MG CAPS Take by mouth.    . doxycycline (VIBRAMYCIN) 100 MG capsule Take 1 capsule (100 mg total) by mouth 2 (two) times daily. Take with food. 14 capsule 0  . gabapentin (NEURONTIN) 300 MG capsule One tab PO qHS for a week, then BID for a week, then TID. May double weekly to a max of 3,600mg /day 90 capsule 3  . lisinopril-hydrochlorothiazide  (PRINZIDE,ZESTORETIC) 20-25 MG tablet Take 1 tablet by mouth daily. MUST KEEP FOLLOW UP APPOINTMENT ON 8/7 FOR MORE REFILLS 12 tablet 0  . Testosterone 20 % CREA by Does not apply route 2 (two) times daily.    Marland Kitchen VITAMIN K PO Take by mouth.     No current facility-administered medications for this visit.    Allergies  Allergen Reactions  . Penicillins        Objective:  BP 135/90   Pulse 69   Wt 193 lb (87.5 kg)   BMI 27.69 kg/m  Gen:  alert, not ill-appearing, no distress, appropriate for age HEENT: head normocephalic without obvious abnormality, conjunctiva and cornea clear, trachea midline Pulm: Normal work of breathing, normal phonation, clear to auscultation bilaterally, no wheezes, rales or rhonchi CV: Normal rate, regular rhythm, s1 and s2 distinct, no murmurs, clicks or rubs  Neuro: alert and oriented x 3, no tremor MSK: extremities atraumatic, normal gait and station, no peripheral edema Skin: intact, no rashes on exposed skin, no jaundice, no cyanosis Psych: well-groomed, cooperative, good eye contact, euthymic mood, affect mood-congruent, speech is articulate, and thought processes clear and goal-directed    No results found for  this or any previous visit (from the past 72 hour(s)). No results found.    Assessment and Plan: 52 y.o. male with   .Onalee HuaDavid was seen today for hypertension.  Diagnoses and all orders for this visit:  Hypertension goal BP (blood pressure) < 130/80 -     Comprehensive metabolic panel -     lisinopril-hydrochlorothiazide (PRINZIDE,ZESTORETIC) 20-25 MG tablet; Take 1 tablet by mouth daily. LABS NEEDED FOR MORE REFILLS  Colon cancer screening Comments: referral placed to GI 01/06/2018 Orders: -     Ambulatory referral to Gastroenterology  Medication monitoring encounter -     Comprehensive metabolic panel   Patient declines fasting labs today. States he had comprehensive labs for his life insurance policy and for monitoring his  testosterone at [facility] this year. We are requesting these labs from [facility] and he was encouraged to locate any medical records on his end. 30-day supply of hypertension medication was sent, but I will need updated renal function and electrolytes for further refills  HTN BP Readings from Last 3 Encounters:  01/06/18 135/90  10/19/17 130/82  09/01/17 133/73  - BP mildly out of range  - counseled on therapeutic lifestyle changes - baby asa for primary prevention - cont Lisinopril-HCTZ   Patient education and anticipatory guidance given Patient agrees with treatment plan Follow-up in 6 months for HTN or sooner as needed if symptoms worsen or fail to improve  Levonne Hubertharley E. Alana Dayton PA-C

## 2018-02-08 ENCOUNTER — Encounter: Payer: Self-pay | Admitting: Physician Assistant

## 2018-02-08 ENCOUNTER — Other Ambulatory Visit: Payer: Self-pay | Admitting: Physician Assistant

## 2018-02-08 DIAGNOSIS — D235 Other benign neoplasm of skin of trunk: Secondary | ICD-10-CM | POA: Diagnosis not present

## 2018-02-08 DIAGNOSIS — L821 Other seborrheic keratosis: Secondary | ICD-10-CM | POA: Diagnosis not present

## 2018-02-08 DIAGNOSIS — I10 Essential (primary) hypertension: Secondary | ICD-10-CM

## 2018-02-08 DIAGNOSIS — L573 Poikiloderma of Civatte: Secondary | ICD-10-CM | POA: Diagnosis not present

## 2018-02-08 DIAGNOSIS — L570 Actinic keratosis: Secondary | ICD-10-CM | POA: Diagnosis not present

## 2018-02-08 DIAGNOSIS — Z808 Family history of malignant neoplasm of other organs or systems: Secondary | ICD-10-CM | POA: Diagnosis not present

## 2018-03-10 ENCOUNTER — Encounter: Payer: Self-pay | Admitting: Physician Assistant

## 2018-06-02 HISTORY — PX: SPINE SURGERY: SHX786

## 2018-06-11 DIAGNOSIS — M48061 Spinal stenosis, lumbar region without neurogenic claudication: Secondary | ICD-10-CM | POA: Diagnosis not present

## 2018-06-11 DIAGNOSIS — M4726 Other spondylosis with radiculopathy, lumbar region: Secondary | ICD-10-CM | POA: Diagnosis not present

## 2018-06-11 DIAGNOSIS — M48062 Spinal stenosis, lumbar region with neurogenic claudication: Secondary | ICD-10-CM | POA: Diagnosis not present

## 2018-06-11 DIAGNOSIS — I10 Essential (primary) hypertension: Secondary | ICD-10-CM | POA: Diagnosis not present

## 2018-07-09 ENCOUNTER — Other Ambulatory Visit: Payer: Self-pay

## 2018-07-09 ENCOUNTER — Emergency Department (INDEPENDENT_AMBULATORY_CARE_PROVIDER_SITE_OTHER)
Admission: EM | Admit: 2018-07-09 | Discharge: 2018-07-09 | Disposition: A | Discharge status: Home / Self Care | Payer: BLUE CROSS/BLUE SHIELD | Source: Home / Self Care

## 2018-07-09 ENCOUNTER — Encounter: Payer: Self-pay | Admitting: Emergency Medicine

## 2018-07-09 DIAGNOSIS — R05 Cough: Secondary | ICD-10-CM

## 2018-07-09 DIAGNOSIS — R059 Cough, unspecified: Secondary | ICD-10-CM

## 2018-07-09 DIAGNOSIS — R6889 Other general symptoms and signs: Secondary | ICD-10-CM

## 2018-07-09 DIAGNOSIS — Z20828 Contact with and (suspected) exposure to other viral communicable diseases: Secondary | ICD-10-CM

## 2018-07-09 MED ORDER — BENZONATATE 100 MG PO CAPS: 100.0000 mg | ORAL_CAPSULE | Freq: Three times a day (TID) | ORAL | 0 refills | Status: DC

## 2018-07-09 MED ORDER — OSELTAMIVIR PHOSPHATE 75 MG PO CAPS: 75.0000 mg | ORAL_CAPSULE | Freq: Two times a day (BID) | ORAL | 0 refills | Status: DC

## 2018-07-09 MED ORDER — PREDNISONE 20 MG PO TABS: ORAL_TABLET | ORAL | 0 refills | Status: DC

## 2018-07-09 NOTE — Discharge Instructions (Signed)
  You may take 500mg acetaminophen every 4-6 hours or in combination with ibuprofen 400-600mg every 6-8 hours as needed for pain, inflammation, and fever.  Be sure to well hydrated with clear liquids and get at least 8 hours of sleep at night, preferably more while sick.   Please follow up with family medicine in 1 week if needed.   

## 2018-07-09 NOTE — ED Provider Notes (Signed)
[facility]    CSN: [id number] Arrival date & time: 07/09/18  1509     History   Chief Complaint Chief Complaint  Patient presents with  . Cough  . Headache    HPI Mark Romero is a 53 y.o. male.   HPI Mark Romero is a 53 y.o. male presenting to UC with c/o sudden onset body aches, cough, congestion and HA with mild fatigue that started yesterday.  He took Nyquil last night with mild relief and ibuprofen around 8AM with some relief. No recorded fever. His coworker was dx with flu this past week. Pt did not receive the flu vaccine. He is currently on amoxicillin for a tooth issue.    Past Medical History:  Diagnosis Date  . 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  Pt has hives too.   Marland Kitchen. GERD (gastroesophageal reflux disease)   . Hypertension     Patient Active Problem List   Diagnosis Date Noted  . Colon cancer screening 01/06/2018  . Medication monitoring encounter 01/06/2018  . Uncontrolled stage 2 hypertension 12/22/2016  . Radiculitis of right cervical region 11/18/2016  . Left lumbar radiculopathy 02/26/2015  . Essential hypertension, benign 02/26/2015  . 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 No pertinent surgical history.     Home Medications    Prior to Admission medications   Medication Sig Start Date End Date Taking? Authorizing Provider  benzonatate (TESSALON) 100 MG capsule Take 1-2 capsules (100-200 mg total) by mouth every 8 (eight) hours. 07/09/18   Lurene ShadowPhelps, Edouard Gikas O, PA-C  cetirizine (ZYRTEC) 10 MG tablet Take 10 mg by mouth daily.    [provider]  cholecalciferol (VITAMIN D) 1000 units tablet Take 1,000 Units by mouth daily.    [provider]  DHEA 10 MG CAPS Take by mouth.    [provider]  doxycycline (VIBRAMYCIN) 100 MG capsule Take 1 capsule (100 mg total) by mouth 2 (two) times daily. Take with food. 10/19/17   Lattie HawBeese, Stephen A, MD  gabapentin  (NEURONTIN) 300 MG capsule One tab PO qHS for a week, then BID for a week, then TID. May double weekly to a max of 3,600mg /day 09/01/17   Monica Bectonhekkekandam, Thomas J, MD  lisinopril-hydrochlorothiazide (PRINZIDE,ZESTORETIC) 20-25 MG tablet Take 1 tablet by mouth daily. 02/08/18   Carlis Stableummings, Charley Elizabeth, PA-C  oseltamivir (TAMIFLU) 75 MG capsule Take 1 capsule (75 mg total) by mouth every 12 (twelve) hours. 07/09/18   Lurene ShadowPhelps, Dragon Thrush O, PA-C  predniSONE (DELTASONE) 20 MG tablet 3 tabs po day one, then 2 po daily x 4 days 07/09/18   Lurene ShadowPhelps, Orie Baxendale O, PA-C  Testosterone 20 % CREA by Does not apply route 2 (two) times daily.    [provider]  VITAMIN K PO Take by mouth.    [provider]    Family History Family History  Problem Relation Age of Onset  . Hypertension Mother   . Hypertension Father     Social History Social History   Tobacco Use  . Smoking status: Never Smoker  . Smokeless tobacco: Never Used  Substance Use Topics  . Alcohol use: Yes    Comment: 2 q wk  . Drug use: No     Allergies   Penicillins   Review of Systems Review of Systems  Constitutional: Positive for fatigue. Negative for chills and fever.  HENT: Positive for congestion. Negative for ear pain, sore throat, trouble swallowing and voice change.  Respiratory: Positive for cough. Negative for shortness of breath.   Cardiovascular: Negative for chest pain and palpitations.  Gastrointestinal: Negative for abdominal pain, diarrhea, nausea and vomiting.  Musculoskeletal: Positive for arthralgias, back pain and myalgias.  Skin: Negative for rash.  Neurological: Positive for headaches. Negative for dizziness and light-headedness.     Physical Exam Triage Vital Signs ED Triage Vitals  Enc Vitals Group     BP 07/09/18 1527 128/85     Pulse Rate 07/09/18 1527 75     Resp 07/09/18 1527 16     Temp 07/09/18 1527 98.5 F (36.9 C)     Temp Source 07/09/18 1527 Oral     SpO2 07/09/18 1527 98 %      Weight 07/09/18 1530 190 lb (86.2 kg)     Height 07/09/18 1530 5\' 10"  (1.778 m)     Head Circumference --      Peak Flow --      Pain Score 07/09/18 1529 2     Pain Loc --      Pain Edu? --      Excl. in GC? --    No data found.  Updated Vital Signs BP 128/85 (BP Location: Right Arm)   Pulse 75   Temp 98.5 F (36.9 C) (Oral)   Resp 16   Ht 5\' 10"  (1.778 m)   Wt 190 lb (86.2 kg)   SpO2 98%   BMI 27.26 kg/m   Visual Acuity Right Eye Distance:   Left Eye Distance:   Bilateral Distance:    Right Eye Near:   Left Eye Near:    Bilateral Near:     Physical Exam Vitals signs and nursing note reviewed.  Constitutional:      Appearance: He is well-developed.  HENT:     Head: Normocephalic and atraumatic.     Right Ear: Tympanic membrane normal.     Left Ear: Tympanic membrane normal.     Nose: Nose normal.     Right Sinus: No maxillary sinus tenderness or frontal sinus tenderness.     Left Sinus: No maxillary sinus tenderness or frontal sinus tenderness.     Mouth/Throat:     Lips: Pink.     Mouth: Mucous membranes are moist.     Pharynx: Oropharynx is clear. Uvula midline.  Neck:     Musculoskeletal: Normal range of motion and neck supple.  Cardiovascular:     Rate and Rhythm: Normal rate and regular rhythm.  Pulmonary:     Effort: Pulmonary effort is normal. No respiratory distress.     Breath sounds: Normal breath sounds. No stridor. No wheezing or rhonchi.  Musculoskeletal: Normal range of motion.  Lymphadenopathy:     Cervical: No cervical adenopathy.  Skin:    General: Skin is warm and dry.  Neurological:     Mental Status: He is alert and oriented to person, place, and time.  Psychiatric:        Behavior: Behavior normal.      UC Treatments / Results  Labs (all labs ordered are listed, but only abnormal results are displayed) Labs Reviewed - No data to display  EKG None  Radiology No results found.  Procedures Procedures (including critical  care time)  Medications Ordered in UC Medications - No data to display  Initial Impression / Assessment and Plan / UC Course  I have reviewed the triage vital signs and the nursing notes.  Pertinent labs & imaging results that were available during my  care of the patient were reviewed by me and considered in my medical decision making (see chart for details).     Hx and exam c/w viral illness due to sudden onset. Suspect flu given known exposure and active flu season Will start pt on tamiflu  Wife suggested pt try prednisone for cough, agree pt may be benefit from prednisone with the body aches and fatigue as well Home care info provided  Final Clinical Impressions(s) / UC Diagnoses   Final diagnoses:  Flu-like symptoms  Cough  Exposure to the flu     Discharge Instructions      You may take 500mg  acetaminophen every 4-6 hours or in combination with ibuprofen 400-600mg  every 6-8 hours as needed for pain, inflammation, and fever.  Be sure to well hydrated with clear liquids and get at least 8 hours of sleep at night, preferably more while sick.   Please follow up with family medicine in 1 week if needed.      ED Prescriptions    Medication Sig Dispense Auth. Provider   benzonatate (TESSALON) 100 MG capsule Take 1-2 capsules (100-200 mg total) by mouth every 8 (eight) hours. 21 capsule Lurene Shadow, PA-C   oseltamivir (TAMIFLU) 75 MG capsule Take 1 capsule (75 mg total) by mouth every 12 (twelve) hours. 10 capsule Doroteo Glassman, Mataeo Ingwersen O, PA-C   predniSONE (DELTASONE) 20 MG tablet 3 tabs po day one, then 2 po daily x 4 days 11 tablet Lurene Shadow, PA-C     Controlled Substance Prescriptions [place] Controlled Substance Registry consulted? Not Applicable   Rolla Plate 07/09/18 1755

## 2018-07-09 NOTE — ED Triage Notes (Signed)
Reports coughing since yesterday with a headache. No known fever. Took ibuprofen 0800.

## 2018-07-22 ENCOUNTER — Emergency Department (INDEPENDENT_AMBULATORY_CARE_PROVIDER_SITE_OTHER)
Admission: EM | Admit: 2018-07-22 | Discharge: 2018-07-22 | Disposition: A | Discharge status: Home / Self Care | Payer: BLUE CROSS/BLUE SHIELD | Source: Home / Self Care | Attending: Family Medicine | Admitting: Family Medicine

## 2018-07-22 ENCOUNTER — Other Ambulatory Visit: Payer: Self-pay

## 2018-07-22 ENCOUNTER — Emergency Department (INDEPENDENT_AMBULATORY_CARE_PROVIDER_SITE_OTHER): Payer: BLUE CROSS/BLUE SHIELD

## 2018-07-22 DIAGNOSIS — R05 Cough: Secondary | ICD-10-CM | POA: Diagnosis not present

## 2018-07-22 DIAGNOSIS — R053 Chronic cough: Secondary | ICD-10-CM

## 2018-07-22 MED ORDER — AZITHROMYCIN 250 MG PO TABS: ORAL_TABLET | ORAL | 0 refills | Status: DC

## 2018-07-22 MED ORDER — PREDNISONE 20 MG PO TABS: ORAL_TABLET | ORAL | 0 refills | Status: DC

## 2018-07-22 MED ORDER — GUAIFENESIN-CODEINE 100-10 MG/5ML PO SOLN: ORAL | 0 refills | Status: DC

## 2018-07-22 NOTE — ED Provider Notes (Signed)
[name]    CSN: [id number] Arrival date & time: 07/22/18  1206     History   Chief Complaint Chief Complaint  Patient presents with  . Cough    HPI Mark Romero is a 53 y.o. male.   Patient was evaluated and treated here two weeks ago for influenza.  He felt that he was almost well 6 days ago, and then 2 days ago his cough increased somewhat.  His chest felt tight and he had increased sneezing.  He denies fevers, chills, and sweats, and no wheezing.  The history is provided by the patient.    Past Medical History:  Diagnosis Date  . 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  Pt has hives too.   Marland Kitchen GERD (gastroesophageal reflux disease)   . Hypertension     Patient Active Problem List   Diagnosis Date Noted  . Colon cancer screening 01/06/2018  . Medication monitoring encounter 01/06/2018  . Uncontrolled stage 2 hypertension 12/22/2016  . Radiculitis of right cervical region 11/18/2016  . Left lumbar radiculopathy 02/26/2015  . Essential hypertension, benign 02/26/2015  . 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 No pertinent surgical history.     Home Medications    Prior to Admission medications   Medication Sig Start Date End Date Taking? Authorizing Provider  azithromycin (ZITHROMAX Z-PAK) 250 MG tablet Take 2 tabs today; then begin one tab once daily for 4 more days. 07/22/18   Lattie Haw, MD  benzonatate (TESSALON) 100 MG capsule Take 1-2 capsules (100-200 mg total) by mouth every 8 (eight) hours. 07/09/18   Lurene Shadow, PA-C  cetirizine (ZYRTEC) 10 MG tablet Take 10 mg by mouth daily.    [provider]  cholecalciferol (VITAMIN D) 1000 units tablet Take 1,000 Units by mouth daily.    [provider]  DHEA 10 MG CAPS Take by mouth.    [provider]  doxycycline (VIBRAMYCIN) 100 MG capsule Take 1 capsule (100 mg total) by mouth 2 (two) times daily. Take with  food. 10/19/17   Lattie Haw, MD  gabapentin (NEURONTIN) 300 MG capsule One tab PO qHS for a week, then BID for a week, then TID. May double weekly to a max of 3,600mg /day 09/01/17   Monica Becton, MD  guaiFENesin-codeine 100-10 MG/5ML syrup Take 52mL by mouth at bedtime as needed for cough.  May repeat dose in 4 to 6 hours. 07/22/18   Lattie Haw, MD  lisinopril-hydrochlorothiazide (PRINZIDE,ZESTORETIC) 20-25 MG tablet Take 1 tablet by mouth daily. 02/08/18   Carlis Stable, PA-C  oseltamivir (TAMIFLU) 75 MG capsule Take 1 capsule (75 mg total) by mouth every 12 (twelve) hours. 07/09/18   Lurene Shadow, PA-C  predniSONE (DELTASONE) 20 MG tablet Take one tab PO BID for 4 days, then one daily. 07/22/18   Lattie Haw, MD  Testosterone 20 % CREA by Does not apply route 2 (two) times daily.    [provider]  VITAMIN K PO Take by mouth.    [provider]    Family History Family History  Problem Relation Age of Onset  . Hypertension Mother   . Hypertension Father     Social History Social History   Tobacco Use  . Smoking status: Never Smoker  . Smokeless tobacco: Never Used  Substance Use Topics  . Alcohol use: Yes    Comment: 2 q wk  . Drug use: No  Allergies   Penicillins   Review of Systems Review of Systems No sore throat + cough No pleuritic pain No wheezing No nasal congestion No post-nasal drainage No sinus pain/pressure No itchy/red eyes No earache No hemoptysis No SOB No fever/chills No nausea No vomiting No abdominal pain No diarrhea No urinary symptoms No skin rash + fatigue No myalgias No headache Used OTC meds without relief   Physical Exam Triage Vital Signs ED Triage Vitals [07/22/18 1233]  Enc Vitals Group     BP (!) 143/98     Pulse Rate 88     Resp      Temp 98 F (36.7 C)     Temp Source Oral     SpO2 97 %     Weight 193 lb (87.5 kg)     Height 5\' 10"  (1.778 m)     Head  Circumference      Peak Flow      Pain Score 0     Pain Loc      Pain Edu?      Excl. in GC?    No data found.  Updated Vital Signs BP (!) 143/98 (BP Location: Right Arm)   Pulse 88   Temp 98 F (36.7 C) (Oral)   Ht 5\' 10"  (1.778 m)   Wt 87.5 kg   SpO2 97%   BMI 27.69 kg/m   Visual Acuity Right Eye Distance:   Left Eye Distance:   Bilateral Distance:    Right Eye Near:   Left Eye Near:    Bilateral Near:     Physical Exam Nursing notes and Vital Signs reviewed. Appearance:  Patient appears stated age, and in no acute distress Eyes:  Pupils are equal, round, and reactive to light and accomodation.  Extraocular movement is intact.  Conjunctivae are not inflamed  Ears:  Canals normal.  Tympanic membranes normal.  Nose:  Mildly congested turbinates.  No sinus tenderness.   Pharynx:  Normal Neck:  Supple.  No adenopathy Lungs:  Clear to auscultation.  Breath sounds are equal.  Moving air well. Heart:  Regular rate and rhythm without murmurs, rubs, or gallops.  Abdomen:  Nontender without masses or hepatosplenomegaly.  Bowel sounds are present.  No CVA or flank tenderness.  Extremities:  No edema.  Skin:  No rash present.    UC Treatments / Results  Labs (all labs ordered are listed, but only abnormal results are displayed) Labs Reviewed - No data to display  EKG None  Radiology Dg Chest 2 View  Result Date: 07/22/2018 CLINICAL DATA:  Productive cough 2 weeks. EXAM: CHEST - 2 VIEW COMPARISON:  None. FINDINGS: Lungs are adequately inflated and otherwise clear. Cardiomediastinal silhouette is normal. Minimal degenerative change of the spine. IMPRESSION: No active cardiopulmonary disease. Electronically Signed   By: Elberta Fortis M.D.   On: 07/22/2018 13:52    Procedures Procedures (including critical care time)  Medications Ordered in UC Medications - No data to display  Initial Impression / Assessment and Plan / UC Course  I have reviewed the triage vital  signs and the nursing notes.  Pertinent labs & imaging results that were available during my care of the patient were reviewed by me and considered in my medical decision making (see chart for details).    Negative chest x-ray reassuring There is no evidence of bacterial infection today.  ?post-infectious cough. Begin prednisone burst/taper.  Rx for Robitussin AC for night time cough.  Rx for Z-pak to  begin if symptoms worsen. Followup with Family Doctor if not improved in about 10 days.   Final Clinical Impressions(s) / UC Diagnoses   Final diagnoses:  Persistent cough     Discharge Instructions     Continue Mucinex daily for cough and congestion with plenty of fluids. Begin taking azithromycin if fever develops.    ED Prescriptions    Medication Sig Dispense Auth. Provider   predniSONE (DELTASONE) 20 MG tablet Take one tab PO BID for 4 days, then one daily. 12 tablet Lattie HawBeese, Stephen A, MD   guaiFENesin-codeine 100-10 MG/5ML syrup Take 10mL by mouth at bedtime as needed for cough.  May repeat dose in 4 to 6 hours. 100 mL Lattie HawBeese, Stephen A, MD   azithromycin (ZITHROMAX Z-PAK) 250 MG tablet Take 2 tabs today; then begin one tab once daily for 4 more days. 6 tablet Lattie HawBeese, Stephen A, MD         Lattie HawBeese, Stephen A, MD 07/22/18 [phone]

## 2018-07-22 NOTE — Discharge Instructions (Addendum)
Continue Mucinex daily for cough and congestion with plenty of fluids. Begin taking azithromycin if fever develops.

## 2018-07-22 NOTE — ED Triage Notes (Signed)
Pt c/o cough 2 weeks. Was seen here 2 weeks ago. Feels better than he did but continues to cough all day and night. Taking mucinex and nyquil prn.

## 2018-08-05 ENCOUNTER — Other Ambulatory Visit: Payer: Self-pay | Admitting: Physician Assistant

## 2018-08-05 DIAGNOSIS — I10 Essential (primary) hypertension: Secondary | ICD-10-CM

## 2018-08-30 ENCOUNTER — Encounter: Payer: Self-pay | Admitting: Emergency Medicine

## 2018-08-30 ENCOUNTER — Emergency Department (INDEPENDENT_AMBULATORY_CARE_PROVIDER_SITE_OTHER)
Admission: EM | Admit: 2018-08-30 | Discharge: 2018-08-30 | Disposition: A | Discharge status: Home / Self Care | Payer: BLUE CROSS/BLUE SHIELD | Source: Home / Self Care

## 2018-08-30 DIAGNOSIS — L255 Unspecified contact dermatitis due to plants, except food: Secondary | ICD-10-CM

## 2018-08-30 MED ORDER — METHYLPREDNISOLONE ACETATE 80 MG/ML IJ SUSP
80.0000 mg | Freq: Once | INTRAMUSCULAR | Status: AC
  Administered 2018-08-30: 80 mg via INTRAMUSCULAR

## 2018-08-30 MED ORDER — PREDNISONE 20 MG PO TABS: ORAL_TABLET | ORAL | 0 refills | Status: DC

## 2018-08-30 NOTE — ED Provider Notes (Signed)
[facility]    CSN: [id number] Arrival date & time: 08/30/18  1129     History   Chief Complaint Chief Complaint  Patient presents with  . Rash    HPI Mark Romero is a 53 y.o. male.   HPI Mark Romero is a 53 y.o. male presenting to UC with c/o gradually worsening erythematous pruritic rash that started 1 week ago after cutting down trees that were covered in vines. Pt believes rash is from poison ivy. No relief with OTC cortisone cream. Pt has done well with oral prednisone in the past but was on 2 rounds of oral prednisone last month for a cough, which made it hard to sleep. Pt would like to try injection of steroids today. Denies fever, chills, n/v/d.    Past Medical History:  Diagnosis Date  . 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  Pt has hives too.   Marland Kitchen GERD (gastroesophageal reflux disease)   . Hypertension     Patient Active Problem List   Diagnosis Date Noted  . Colon cancer screening 01/06/2018  . Medication monitoring encounter 01/06/2018  . Uncontrolled stage 2 hypertension 12/22/2016  . Radiculitis of right cervical region 11/18/2016  . Left lumbar radiculopathy 02/26/2015  . Essential hypertension, benign 02/26/2015  . 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 No pertinent surgical history.     Home Medications    Prior to Admission medications   Medication Sig Start Date End Date Taking? Authorizing Provider  azithromycin (ZITHROMAX Z-PAK) 250 MG tablet Take 2 tabs today; then begin one tab once daily for 4 more days. 07/22/18   Lattie Haw, MD  benzonatate (TESSALON) 100 MG capsule Take 1-2 capsules (100-200 mg total) by mouth every 8 (eight) hours. 07/09/18   Lurene Shadow, PA-C  cetirizine (ZYRTEC) 10 MG tablet Take 10 mg by mouth daily.    [provider]  cholecalciferol (VITAMIN D) 1000 units tablet Take 1,000 Units by mouth daily.    [provider]  DHEA 10 MG  CAPS Take by mouth.    [provider]  doxycycline (VIBRAMYCIN) 100 MG capsule Take 1 capsule (100 mg total) by mouth 2 (two) times daily. Take with food. 10/19/17   Lattie Haw, MD  gabapentin (NEURONTIN) 300 MG capsule One tab PO qHS for a week, then BID for a week, then TID. May double weekly to a max of 3,600mg /day 09/01/17   Monica Becton, MD  guaiFENesin-codeine 100-10 MG/5ML syrup Take 77mL by mouth at bedtime as needed for cough.  May repeat dose in 4 to 6 hours. 07/22/18   Lattie Haw, MD  lisinopril-hydrochlorothiazide (PRINZIDE,ZESTORETIC) 20-25 MG tablet Take 1 tablet by mouth daily. Due for follow up visit w/PCP 08/05/18   Carlis Stable, PA-C  oseltamivir (TAMIFLU) 75 MG capsule Take 1 capsule (75 mg total) by mouth every 12 (twelve) hours. 07/09/18   Lurene Shadow, PA-C  predniSONE (DELTASONE) 20 MG tablet 3 tabs po day one, then 2 po daily x 4 days 08/30/18   Lurene Shadow, PA-C  Testosterone 20 % CREA by Does not apply route 2 (two) times daily.    [provider]  VITAMIN K PO Take by mouth.    [provider]    Family History Family History  Problem Relation Age of Onset  . Hypertension Mother   . Hypertension Father     Social History Social History  Tobacco Use  . Smoking status: Never Smoker  . Smokeless tobacco: Never Used  Substance Use Topics  . Alcohol use: Yes    Comment: 2 q wk  . Drug use: No     Allergies   Penicillins   Review of Systems Review of Systems  Constitutional: Negative for chills and fever.  Musculoskeletal: Negative for arthralgias and joint swelling.  Skin: Positive for rash.     Physical Exam Triage Vital Signs ED Triage Vitals  Enc Vitals Group     BP 08/30/18 1149 129/87     Pulse Rate 08/30/18 1149 70     Resp --      Temp 08/30/18 1149 98.2 F (36.8 C)     Temp Source 08/30/18 1149 Oral     SpO2 08/30/18 1149 98 %     Weight 08/30/18 1150 190 lb (86.2 kg)      Height --      Head Circumference --      Peak Flow --      Pain Score 08/30/18 1149 0     Pain Loc --      Pain Edu? --      Excl. in GC? --    No data found.  Updated Vital Signs BP 129/87 (BP Location: Right Arm)   Pulse 70   Temp 98.2 F (36.8 C) (Oral)   Wt 190 lb (86.2 kg)   SpO2 98%   BMI 27.26 kg/m   Visual Acuity Right Eye Distance:   Left Eye Distance:   Bilateral Distance:    Right Eye Near:   Left Eye Near:    Bilateral Near:     Physical Exam Vitals signs and nursing note reviewed.  Constitutional:      Appearance: Normal appearance. He is well-developed.  HENT:     Head: Normocephalic and atraumatic.  Neck:     Musculoskeletal: Normal range of motion.  Cardiovascular:     Rate and Rhythm: Normal rate.  Pulmonary:     Effort: Pulmonary effort is normal.  Musculoskeletal: Normal range of motion.  Skin:    General: Skin is warm and dry.     Findings: Erythema and rash present.     Comments: Diffuse erythematous papular and vesicular rash on bilateral lower legs, Right lower abdomen and Left lower abdomen. Rash does blanch, non-tender. Some areas of scant clear-yellow discharge.   Neurological:     Mental Status: He is alert and oriented to person, place, and time.  Psychiatric:        Behavior: Behavior normal.      UC Treatments / Results  Labs (all labs ordered are listed, but only abnormal results are displayed) Labs Reviewed - No data to display  EKG None  Radiology No results found.  Procedures Procedures (including critical care time)  Medications Ordered in UC Medications  methylPREDNISolone acetate (DEPO-MEDROL) injection 80 mg (80 mg Intramuscular Given 08/30/18 1157)    Initial Impression / Assessment and Plan / UC Course  I have reviewed the triage vital signs and the nursing notes.  Pertinent labs & imaging results that were available during my care of the patient were reviewed by me and considered in my medical  decision making (see chart for details).    Hx and exam c/w contact dermatitis w/o underlying bacterial infection  Final Clinical Impressions(s) / UC Diagnoses   Final diagnoses:  Contact dermatitis due to plant     Discharge Instructions  You were given a shot of depo-medrol (a steroid) today to help with itching and swelling from a likely allergic reaction.  You have been prescribed 5 days of prednisone, an oral steroid.  You may start this medication tomorrow with breakfast.    You may continue to take your Zyrtec and use the topical hydrocortisone.      ED Prescriptions    Medication Sig Dispense Auth. Provider   predniSONE (DELTASONE) 20 MG tablet 3 tabs po day one, then 2 po daily x 4 days 11 tablet Lurene Shadow, PA-C     Controlled Substance Prescriptions [place] Controlled Substance Registry consulted? Not Applicable   Rolla Plate 08/30/18 1341

## 2018-08-30 NOTE — ED Triage Notes (Signed)
Pt c/o legs itching and rash x1 week. Started after working outside. Using cortisone cream.

## 2018-08-30 NOTE — Discharge Instructions (Signed)
°  You were given a shot of depo-medrol (a steroid) today to help with itching and swelling from a likely allergic reaction.  You have been prescribed 5 days of prednisone, an oral steroid.  You may start this medication tomorrow with breakfast.    You may continue to take your Zyrtec and use the topical hydrocortisone.

## 2018-09-08 DIAGNOSIS — M5416 Radiculopathy, lumbar region: Secondary | ICD-10-CM | POA: Diagnosis not present

## 2018-09-08 DIAGNOSIS — M48061 Spinal stenosis, lumbar region without neurogenic claudication: Secondary | ICD-10-CM | POA: Diagnosis not present

## 2018-09-20 ENCOUNTER — Other Ambulatory Visit: Payer: Self-pay | Admitting: Physician Assistant

## 2018-09-20 DIAGNOSIS — I10 Essential (primary) hypertension: Secondary | ICD-10-CM

## 2018-09-22 ENCOUNTER — Other Ambulatory Visit: Payer: Self-pay | Admitting: Physician Assistant

## 2018-09-22 DIAGNOSIS — I10 Essential (primary) hypertension: Secondary | ICD-10-CM

## 2018-09-23 NOTE — Telephone Encounter (Signed)
Patient long overdue for his metabolic panel No refills until he has labs Schedule office visit

## 2018-09-28 ENCOUNTER — Encounter: Payer: Self-pay | Admitting: Physician Assistant

## 2018-09-28 ENCOUNTER — Ambulatory Visit (INDEPENDENT_AMBULATORY_CARE_PROVIDER_SITE_OTHER): Payer: BLUE CROSS/BLUE SHIELD | Admitting: Physician Assistant

## 2018-09-28 VITALS — BP 147/108 | HR 72 | Temp 98.4°F | Wt 194.0 lb

## 2018-09-28 DIAGNOSIS — Z79899 Other long term (current) drug therapy: Secondary | ICD-10-CM

## 2018-09-28 DIAGNOSIS — Z5181 Encounter for therapeutic drug level monitoring: Secondary | ICD-10-CM

## 2018-09-28 DIAGNOSIS — I10 Essential (primary) hypertension: Secondary | ICD-10-CM | POA: Diagnosis not present

## 2018-09-28 DIAGNOSIS — Z13220 Encounter for screening for lipoid disorders: Secondary | ICD-10-CM

## 2018-09-28 DIAGNOSIS — Z1211 Encounter for screening for malignant neoplasm of colon: Secondary | ICD-10-CM | POA: Diagnosis not present

## 2018-09-28 MED ORDER — LISINOPRIL-HYDROCHLOROTHIAZIDE 20-25 MG PO TABS: 1.0000 | ORAL_TABLET | Freq: Every day | ORAL | 1 refills | Status: DC

## 2018-09-28 NOTE — Patient Instructions (Addendum)
For your blood pressure: - Goal <130/80 (Ideally 120's/70's) - take your blood pressure medication in the morning (unless instructed differently) - monitor and log blood pressures at home - check around the same time each day in a relaxed setting - Limit salt to <2500 mg/day - Follow DASH (Dietary Approach to Stopping Hypertension) eating plan - Try to get at least 150 minutes of aerobic exercise per week - Aim to go on a brisk walk 30 minutes per day at least 5 days per week. If you're not active, gradually increase how long you walk by 5 minutes each week - limit alcohol: 2 standard drinks per day for men and 1 per day for women - avoid tobacco/nicotine products. Consider smoking cessation if you smoke - weight loss: 7% of current body weight can reduce your blood pressure by 5-10 points - follow-up at least every 6 months for your blood pressure. Follow-up sooner if your BP is not controlled   

## 2018-09-28 NOTE — Progress Notes (Signed)
HPI:                                                                Mark Romero is a 53 y.o. male who presents to [facility]: [facility] today for hypertension follow-up  HTN: taking Lisinopril-HCTZ 20-25 mg daily. Compliant with medications, but ran out approx 5 days ago. Checks BP's at home. BP range 130's/80's-90's. 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 Denies vision change, headache, chest pain with exertion, orthopnea, lightheadedness, syncope and edema. Risk factors include: male sex  He underwent back surgery with Dr. Loney Hering in [place] in January 2020. Still endorses left-sided radiculopathy, unchanged for the last 10 years.  Depression screen [facility] 2/9 09/28/2018 12/22/2016  Decreased Interest 0 0  Down, Depressed, Hopeless 0 0  PHQ - 2 Score 0 0    Past Medical History:  Diagnosis Date  . 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  Pt has hives too.   Marland Kitchen GERD (gastroesophageal reflux disease)   . Hypertension    History reviewed. No pertinent surgical history. Social History   Tobacco Use  . Smoking status: Never Smoker  . Smokeless tobacco: Never Used  Substance Use Topics  . Alcohol use: Yes    Comment: 2 q wk   family history includes Hypertension in his father and mother.    ROS: negative except as noted in the HPI  Medications: Current Outpatient Medications  Medication Sig Dispense Refill  . cetirizine (ZYRTEC) 10 MG tablet Take 10 mg by mouth daily.    . cholecalciferol (VITAMIN D) 1000 units tablet Take 1,000 Units by mouth daily.    Marland Kitchen lisinopril-hydrochlorothiazide (ZESTORETIC) 20-25 MG tablet Take 1 tablet by mouth daily. Due for follow up visit w/PCP 90 tablet 1  . VITAMIN K PO Take by mouth.    Marland Kitchen DHEA 10 MG CAPS Take by mouth.    . Testosterone 20 % CREA by Does not apply route 2 (two) times daily.     No current facility-administered  medications for this visit.    Allergies  Allergen Reactions  . Penicillins        Objective:  BP (!) 147/108   Pulse 72   Temp 98.4 F (36.9 C) (Oral)   Wt 194 lb (88 kg)   BMI 27.84 kg/m  Gen:  alert, not ill-appearing, no distress, appropriate for age HEENT: head normocephalic without obvious abnormality, conjunctiva and cornea clear, trachea midline Pulm: Normal work of breathing, normal phonation Neuro: alert and oriented x 3, no tremor MSK: extremities atraumatic, normal gait and station Skin: intact, no rashes on exposed skin, no jaundice, no cyanosis Psych: well-groomed, cooperative, good eye contact, euthymic mood, affect mood-congruent, speech is articulate, and thought processes clear and goal-directed  BP Readings from Last 3 Encounters:  09/28/18 (!) 147/108  08/30/18 129/87  07/22/18 (!) 143/98    Assessment and Plan: 53 y.o. male with   .Diagnoses and all orders for this visit:  Essential hypertension with goal blood pressure less than 130/80 -     COMPLETE METABOLIC PANEL WITH GFR -     Lipid Panel w/reflex Direct LDL  Encounter for monitoring ACE-inhibitor therapy -  COMPLETE METABOLIC PANEL WITH GFR  Screening for lipid disorders -     Lipid Panel w/reflex Direct LDL  Colon cancer screening -     Cologuard  Hypertension goal BP (blood pressure) < 130/80 -     lisinopril-hydrochlorothiazide (ZESTORETIC) 20-25 MG tablet; Take 1 tablet by mouth daily. Due for follow up visit w/PCP  Elevated blood pressure reading with diagnosis of hypertension   BP out of range in office today, out of medication Home readings are in stage 1 hypertensive range CMP and lipids pending Counseled on therapeutic lifestyle changes   Patient education and anticipatory guidance given Patient agrees with treatment plan Follow-up in 6 months or sooner as needed if symptoms worsen or fail to improve  Levonne Hubertharley E. Isidra Mings PA-C

## 2018-09-29 LAB — LIPID PANEL W/REFLEX DIRECT LDL
Cholesterol: 151 mg/dL (ref <200)
HDL: 37 mg/dL — ABNORMAL LOW (ref >=40)
LDL Cholesterol (Calc): 95 mg/dL (calc)
Non-HDL Cholesterol (Calc): 114 mg/dL (calc) (ref <130)
Total CHOL/HDL Ratio: 4.1 (calc) (ref <5.0)
Triglycerides: 96 mg/dL (ref <150)

## 2018-09-29 LAB — COMPLETE METABOLIC PANEL WITH GFR
AG Ratio: 2.1 (calc) (ref 1.0–2.5)
ALT: 29 U/L (ref 9–46)
AST: 22 U/L (ref 10–35)
Albumin: 4.4 g/dL (ref 3.6–5.1)
Alkaline phosphatase (APISO): 49 U/L (ref 35–144)
BUN: 12 mg/dL (ref 7–25)
CO2: 27 mmol/L (ref 20–32)
Calcium: 9.5 mg/dL (ref 8.6–10.3)
Chloride: 106 mmol/L (ref 98–110)
Creat: 0.87 mg/dL (ref 0.70–1.33)
GFR, Est African American: 113 mL/min/{1.73_m2} (ref >=60)
GFR, Est Non African American: 98 mL/min/{1.73_m2} (ref >=60)
Globulin: 2.1 g/dL (calc) (ref 1.9–3.7)
Glucose, Bld: 111 mg/dL — ABNORMAL HIGH (ref 65–99)
Potassium: 4.4 mmol/L (ref 3.5–5.3)
Sodium: 138 mmol/L (ref 135–146)
Total Bilirubin: 0.6 mg/dL (ref 0.2–1.2)
Total Protein: 6.5 g/dL (ref 6.1–8.1)

## 2018-10-04 ENCOUNTER — Encounter: Payer: Self-pay | Admitting: Physician Assistant

## 2018-10-06 DIAGNOSIS — M5416 Radiculopathy, lumbar region: Secondary | ICD-10-CM | POA: Diagnosis not present

## 2018-12-06 DIAGNOSIS — M48061 Spinal stenosis, lumbar region without neurogenic claudication: Secondary | ICD-10-CM | POA: Diagnosis not present

## 2018-12-06 DIAGNOSIS — M5416 Radiculopathy, lumbar region: Secondary | ICD-10-CM | POA: Diagnosis not present

## 2018-12-06 DIAGNOSIS — Z6826 Body mass index (BMI) 26.0-26.9, adult: Secondary | ICD-10-CM | POA: Diagnosis not present

## 2018-12-08 DIAGNOSIS — I10 Essential (primary) hypertension: Secondary | ICD-10-CM | POA: Diagnosis not present

## 2018-12-08 DIAGNOSIS — E559 Vitamin D deficiency, unspecified: Secondary | ICD-10-CM | POA: Diagnosis not present

## 2018-12-08 DIAGNOSIS — R5381 Other malaise: Secondary | ICD-10-CM | POA: Diagnosis not present

## 2018-12-08 DIAGNOSIS — E291 Testicular hypofunction: Secondary | ICD-10-CM | POA: Diagnosis not present

## 2019-01-05 DIAGNOSIS — M48061 Spinal stenosis, lumbar region without neurogenic claudication: Secondary | ICD-10-CM | POA: Diagnosis not present

## 2019-01-05 DIAGNOSIS — M47816 Spondylosis without myelopathy or radiculopathy, lumbar region: Secondary | ICD-10-CM | POA: Diagnosis not present

## 2019-01-05 DIAGNOSIS — M5416 Radiculopathy, lumbar region: Secondary | ICD-10-CM | POA: Diagnosis not present

## 2019-01-05 DIAGNOSIS — M4316 Spondylolisthesis, lumbar region: Secondary | ICD-10-CM | POA: Diagnosis not present

## 2019-01-29 IMAGING — MR MR LUMBAR SPINE W/O CM
4 of 5 series · 26 of 48 positions shown · non-contrast
Comparison: Plain films 02/26/2015.  MR 04/09/2015.

CLINICAL DATA: Low back and LEFT leg pain.  Symptoms for 8 years.

EXAM:
MRI LUMBAR SPINE WITHOUT CONTRAST
TECHNIQUE: Multiplanar, multisequence MR imaging of the lumbar spine was
performed. No intravenous contrast was administered.

[Series 2: T2 · sagittal · 4.0mm · 0.81mm/px · 6 of 17 slices shown (1 of 2)]
[im 1/17]
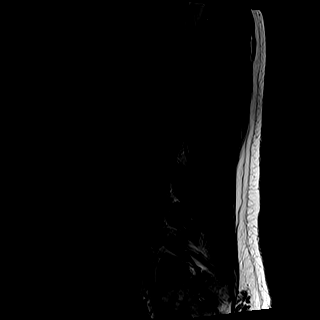
[im 4/17]
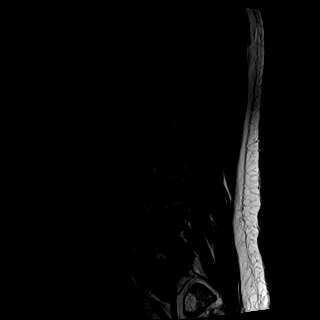
[im 7/17]
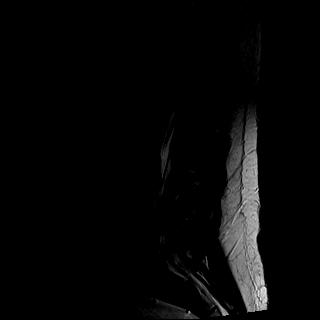
[im 10/17]
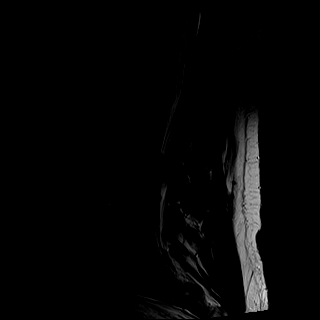
[im 13/17]
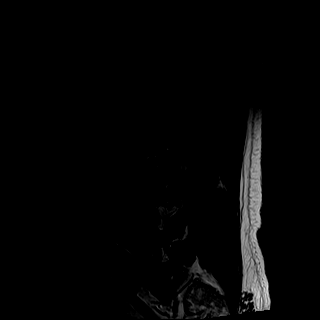
[im 17/17]
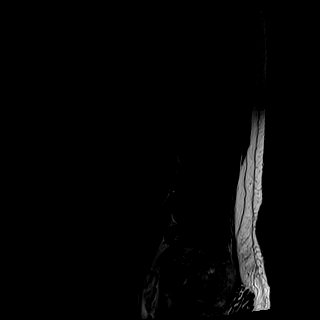

[Series 3: T1 · sagittal · 4.0mm · 0.41mm/px · 6 of 17 slices shown (1 of 2)]
[im 1/17]
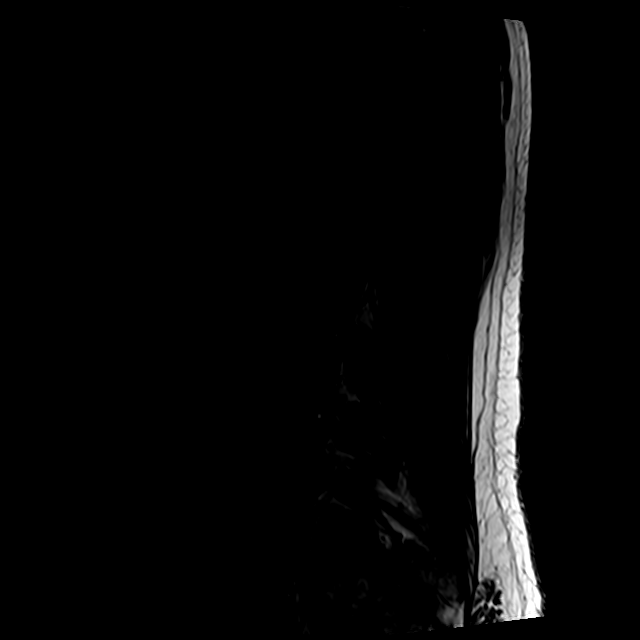
[im 4/17]
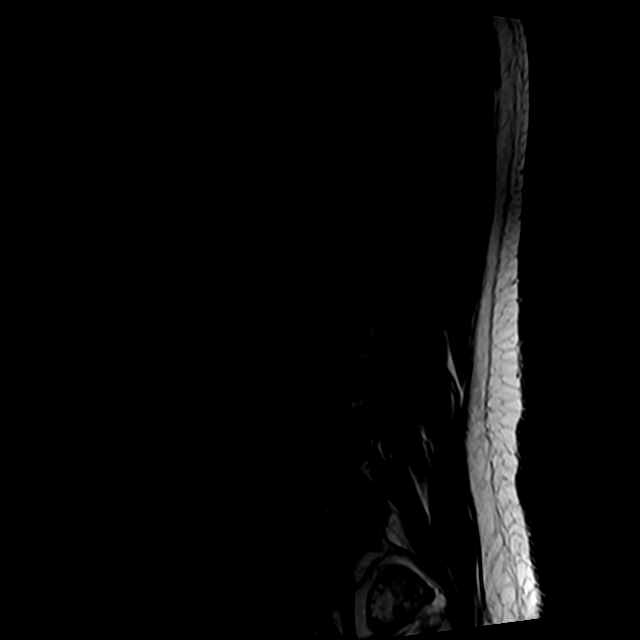
[im 7/17]
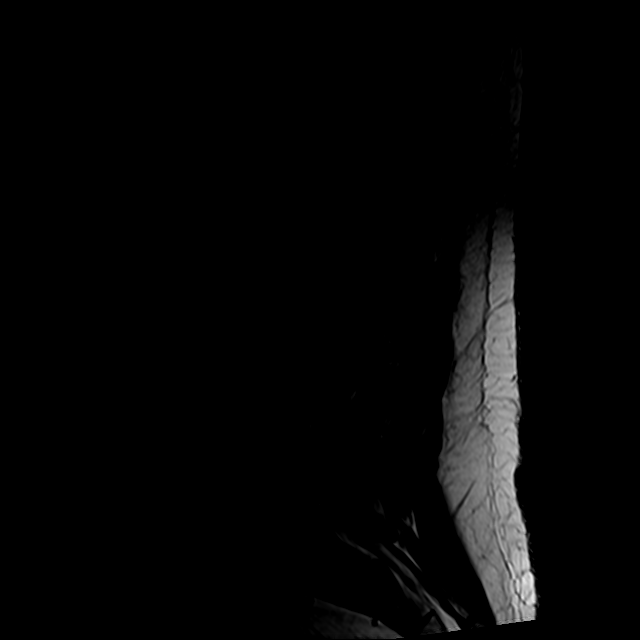
[im 10/17]
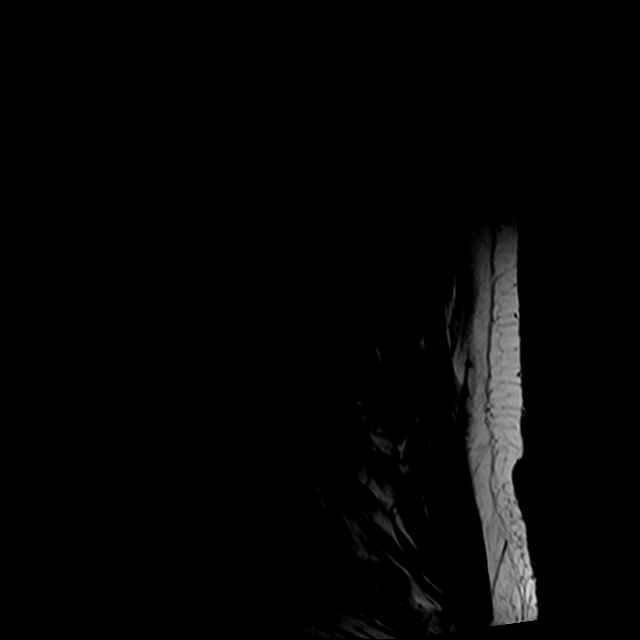
[im 13/17]
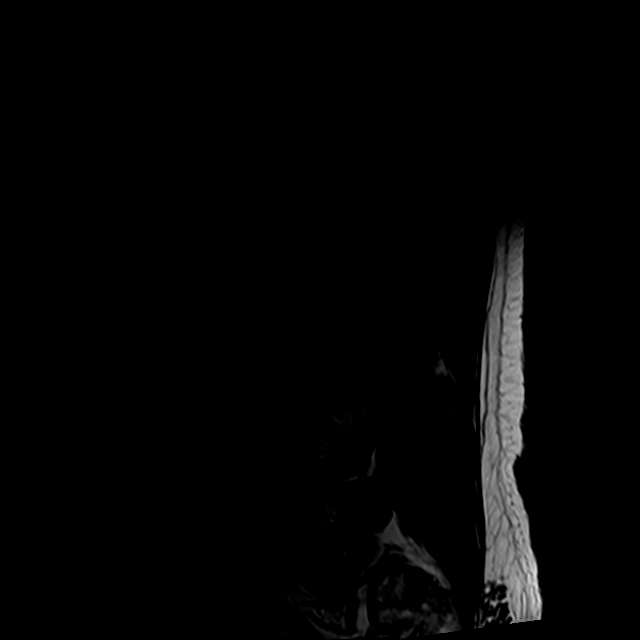
[im 17/17]
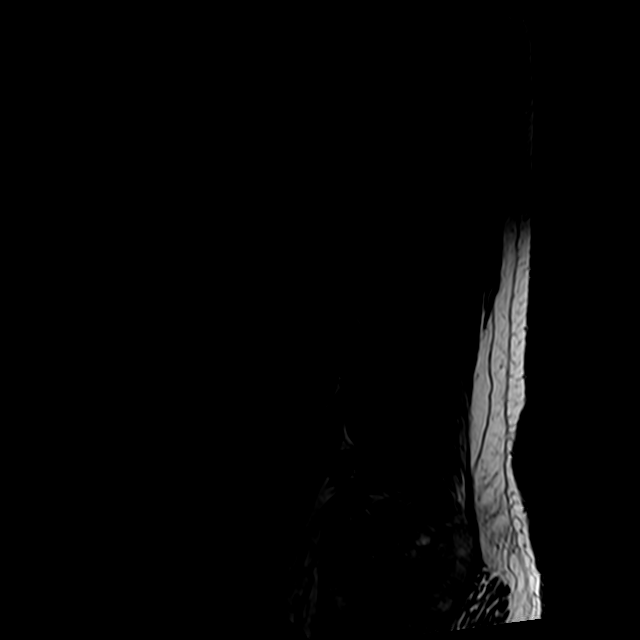

[Series 5: T2 · axial · 4.0mm · 0.78mm/px · z∈[-155,+76]mm · 9 of 43 slices shown (2 of 2)]
[im 1/43]
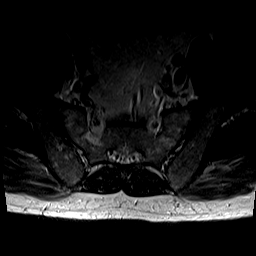
[im 7/43]
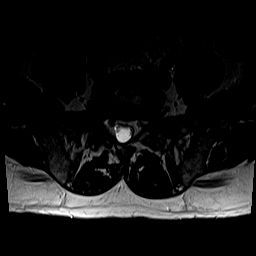
[im 13/43]
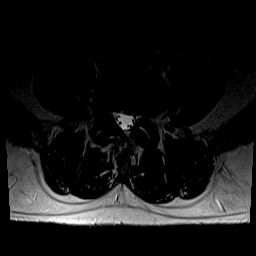
[im 19/43]
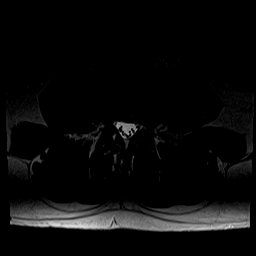
[im 22/43]
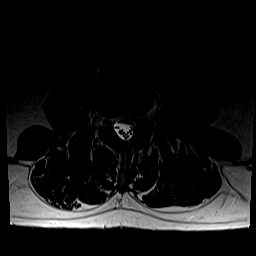
[im 25/43]
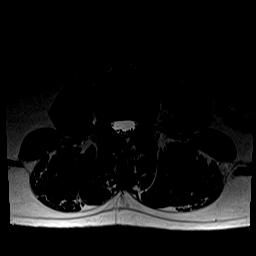
[im 31/43]
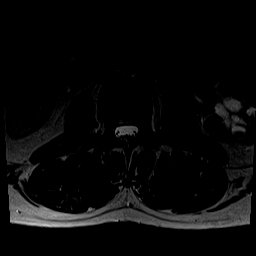
[im 37/43]
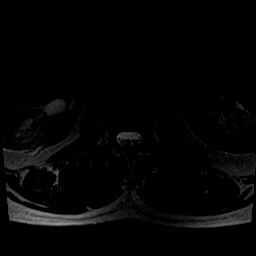
[im 43/43]
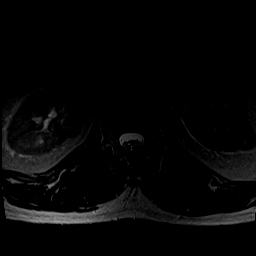

[Series 6: T1 · axial · 4.0mm · 0.39mm/px · z∈[-155,+46]mm · 5 of 43 slices shown (2 of 2)]
[im 1/43]
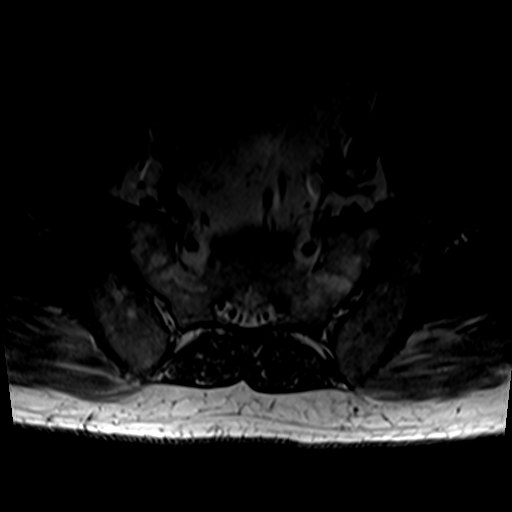
[im 7/43]
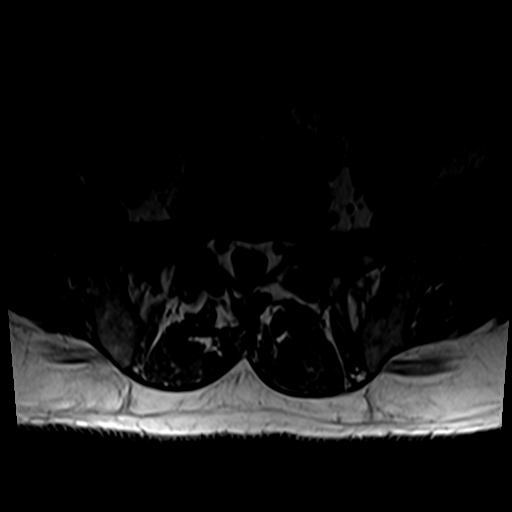
[im 13/43]
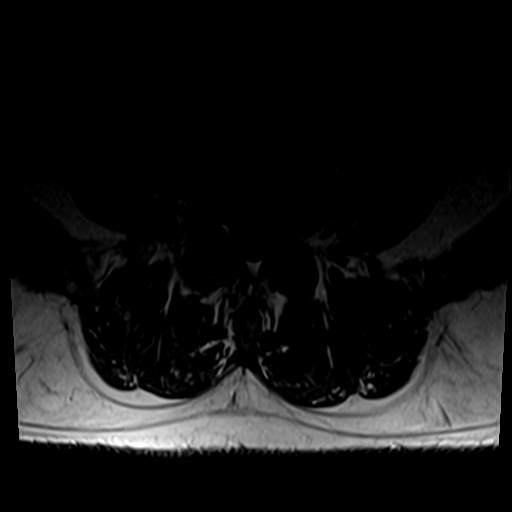
[im 22/43]
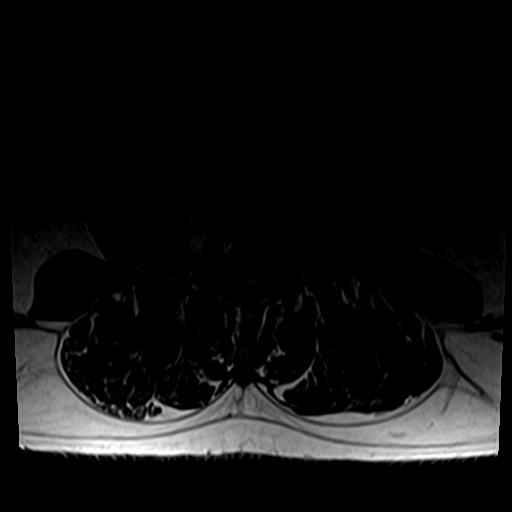
[im 37/43]
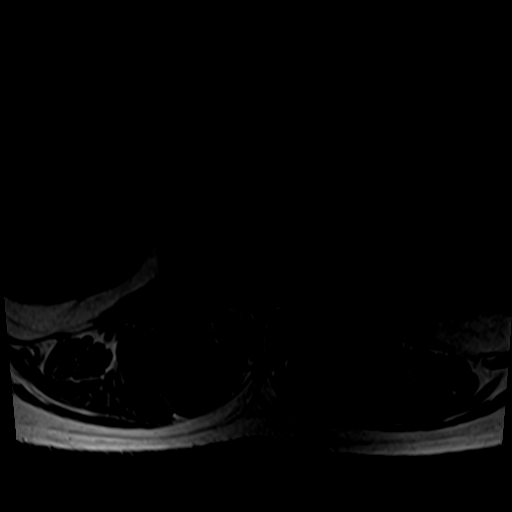

[26 of 48 positions shown; findings below may reference images not displayed]

FINDINGS: Segmentation:  Standard.

Alignment:  4 mm anterolisthesis L3-L4 secondary to pars defects.

Vertebrae: Minor endplate reactive changes above and below
L3-4.BILATERAL L3 spondylolysis.

Conus medullaris and cauda equina: Conus extends to the L1 level.
Conus and cauda equina appear normal.

Paraspinal and other soft tissues: BILATERAL LEFT greater than RIGHT
parapelvic cysts. Suspected LEFT renal calculus in the dependent
lower pole calyx.

Disc levels:

L1-L2:  Disc space narrowing with annular bulge.  No impingement.

L2-L3:  Normal disc space.

L3-L4: 4 mm slip. Disc space narrowing. Central and leftward
extrusion, extending to the foramen. BILATERAL L3 pars defects. Mild
to stenosis. Severe LEFT L3 nerve root impingement. Borderline LEFT
L4 nerve root impingement.

L4-L5: Disc desiccation. Annular bulge. Facet arthropathy. No
definite impingement.

L5-S1: Good disc height and hydration. Annular bulge. Facet
arthropathy. No impingement.

Compared with 7638, there is increasing anterolisthesis, and
increasing LEFT L3 nerve root compromise.
IMPRESSION: Progressive neural impingement at L3-4 related to 4 mm
anterolisthesis, BILATERAL L3 spondylolysis, along with central and
leftward extrusion to the foramen. Severe LEFT L3, borderline LEFT
L4 nerve root impingement.

Parapelvic cysts, suspected nonobstructing LEFT renal calculus.

Consider renal ultrasound for further evaluation.

## 2019-03-17 ENCOUNTER — Encounter: Payer: Self-pay | Admitting: *Deleted

## 2019-03-17 ENCOUNTER — Other Ambulatory Visit: Payer: Self-pay

## 2019-03-17 ENCOUNTER — Emergency Department (INDEPENDENT_AMBULATORY_CARE_PROVIDER_SITE_OTHER)
Admission: EM | Admit: 2019-03-17 | Discharge: 2019-03-17 | Disposition: A | Discharge status: Home / Self Care | Payer: BC Managed Care – PPO | Source: Home / Self Care | Attending: Emergency Medicine | Admitting: Emergency Medicine

## 2019-03-17 DIAGNOSIS — H60312 Diffuse otitis externa, left ear: Secondary | ICD-10-CM | POA: Diagnosis not present

## 2019-03-17 MED ORDER — NEOMYCIN-POLYMYXIN-HC 3.5-10000-1 OT SUSP: 4.0000 [drp] | Freq: Three times a day (TID) | OTIC | 1 refills | Status: AC

## 2019-03-17 MED ORDER — AZITHROMYCIN 250 MG PO TABS: ORAL_TABLET | ORAL | 0 refills | Status: AC

## 2019-03-17 NOTE — ED Triage Notes (Signed)
Pt c/o LT ear pain and swelling x 1 week. Denies fever.

## 2019-03-17 NOTE — ED Provider Notes (Signed)
[name]    CSN: [id number] Arrival date & time: 03/17/19  1100      History   Chief Complaint Chief Complaint  Patient presents with  . Otalgia    HPI Mark Romero is a 53 y.o. male.   HPI Pt c/o LT ear pain and swelling x 1 week.  Not sure if he had a low-grade fever or not.  Not documented.  Minimal sinus congestion.  No cough.  No shortness of breath or chest pain. He states that whenever he gets an external ear infection like this, he needs an antibiotic eardrop and specifically Zithromax Z-Pak will resolve it.  States he has allergy to penicillin, so we are avoiding penicillin. Denies change of taste or smell.  Scant discolored rhinorrhea. No problems breathing or swallowing or sore throat.  No new rash.  Past Medical History:  Diagnosis Date  . 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  Pt has hives too.   Marland Kitchen GERD (gastroesophageal reflux disease)   . Hypertension     Patient Active Problem List   Diagnosis Date Noted  . Encounter for monitoring ACE-inhibitor therapy 09/28/2018  . Elevated blood pressure reading with diagnosis of hypertension 09/28/2018  . Colon cancer screening 01/06/2018  . Medication monitoring encounter 01/06/2018  . Essential hypertension with goal blood pressure less than 130/80 12/22/2016  . Radiculitis of right cervical region 11/18/2016  . Left lumbar radiculopathy 02/26/2015  . 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 SPINE SURGERY  06/2018   L3-L4       Home Medications    Prior to Admission medications   Medication Sig Start Date End Date Taking? Authorizing Provider  azithromycin (ZITHROMAX Z-PAK) 250 MG tablet Take 2 tablets on day one, then 1 tablet daily on days 2 through 5 03/17/19   Mark Manes, MD  cetirizine (ZYRTEC) 10 MG tablet Take 10 mg by mouth daily.    [provider]  cholecalciferol (VITAMIN D) 1000  units tablet Take 1,000 Units by mouth daily.    [provider]  DHEA 10 MG CAPS Take by mouth.    [provider]  lisinopril-hydrochlorothiazide (ZESTORETIC) 20-25 MG tablet Take 1 tablet by mouth daily. Due for follow up visit w/PCP 09/28/18   Carlis Stable, PA-C  neomycin-polymyxin-hydrocortisone (CORTISPORIN) 3.5-10000-1 OTIC suspension Place 4 drops into the right ear 3 (three) times daily. 03/17/19   Mark Manes, MD  Testosterone 20 % CREA by Does not apply route 2 (two) times daily.    [provider]  VITAMIN K PO Take by mouth.    [provider]    Family History Family History  Problem Relation Age of Onset  . Hypertension Mother   . Hypertension Father     Social History Social History   Tobacco Use  . Smoking status: Never Smoker  . Smokeless tobacco: Never Used  Substance Use Topics  . Alcohol use: Yes    Comment: 2 q wk  . Drug use: No     Allergies   Penicillins   Review of Systems Review of Systems  All other systems reviewed and are negative.    Physical Exam Triage Vital Signs ED Triage Vitals  Enc Vitals Group     BP 03/17/19 1113 (!) 146/93     Pulse Rate 03/17/19 1113 68     Resp 03/17/19 1113 18     Temp 03/17/19  1113 98.2 F (36.8 C)     Temp Source 03/17/19 1113 Oral     SpO2 03/17/19 1113 98 %     Weight 03/17/19 1114 200 lb (90.7 kg)     Height 03/17/19 1114 5\' 10"  (1.778 m)     Head Circumference --      Peak Flow --      Pain Score 03/17/19 1114 5     Pain Loc --      Pain Edu? --      Excl. in GC? --    No data found.  Updated Vital Signs BP (!) 146/93 (BP Location: Right Arm)   Pulse 68   Temp 98.2 F (36.8 C) (Oral)   Resp 18   Ht 5\' 10"  (1.778 m)   Wt 90.7 kg   SpO2 98%   BMI 28.70 kg/m    Physical Exam Vitals signs and nursing note reviewed.  Constitutional:      General: He is not in acute distress.    Appearance: He is well-developed.  HENT:     Head:  Normocephalic and atraumatic.     Right Ear: Tympanic membrane, ear canal and external ear normal. There is no impacted cerumen.     Left Ear: There is no impacted cerumen.     Ears:     Comments: Left TM minimal erythema but normal landmarks. Left external canal very swollen, red inflamed, but patent.  No foreign body or cerumen impaction.  Very tender to palpation left external ear and pinna.  No vesicles or pustules.  No mastoid tenderness.    Mouth/Throat:     Mouth: Mucous membranes are moist. No oral lesions.     Pharynx: No oropharyngeal exudate or posterior oropharyngeal erythema.  Eyes:     General: No scleral icterus.    Conjunctiva/sclera: Conjunctivae normal.  Neck:     Musculoskeletal: Neck supple.  Cardiovascular:     Rate and Rhythm: Normal rate and regular rhythm.     Heart sounds: Normal heart sounds.  Pulmonary:     Effort: Pulmonary effort is normal.     Breath sounds: Normal breath sounds. No stridor. No wheezing, rhonchi or rales.  Abdominal:     General: There is no distension.  Musculoskeletal: Normal range of motion.  Lymphadenopathy:     Cervical: No cervical adenopathy.  Skin:    General: Skin is warm and dry.     Capillary Refill: Capillary refill takes less than 2 seconds.  Neurological:     Mental Status: He is alert and oriented to person, place, and time.     Cranial Nerves: No cranial nerve deficit.      UC Treatments / Results  Labs (all labs ordered are listed, but only abnormal results are displayed) Labs Reviewed - No data to display  EKG   Radiology No results found.  Procedures Procedures (including critical care time)  Medications Ordered in UC Medications - No data to display  Initial Impression / Assessment and Plan / UC Course  I have reviewed the triage vital signs and the nursing notes.  Pertinent labs & imaging results that were available during my care of the patient were reviewed by me and considered in my medical  decision making (see chart for details).     Severe diffuse left otitis externa. We discussed treatment options at length.  He states Zithromax Z-Pak and eardrops always works, and I suggested possible other antibiotics by mouth, but he was quite adamant  about the Zithromax. Prescribe Z-Pak and Corticosporin otic drops. Warm compresses and other advice given. He declined AVS or any written instructions. Follow-up with your primary care doctor or ENT in 5-7 days if not improving, or sooner if symptoms become worse. Precautions discussed. Red flags discussed. Questions invited and answered. Patient voiced understanding and agreement.  Final Clinical Impressions(s) / UC Diagnoses   Final diagnoses:  Acute diffuse otitis externa of left ear   Discharge Instructions   None    ED Prescriptions    Medication Sig Dispense Auth. Provider   azithromycin (ZITHROMAX Z-PAK) 250 MG tablet Take 2 tablets on day one, then 1 tablet daily on days 2 through 5 1 each Jacqulyn Cane, MD   neomycin-polymyxin-hydrocortisone (CORTISPORIN) 3.5-10000-1 OTIC suspension Place 4 drops into the right ear 3 (three) times daily. 10 mL Jacqulyn Cane, MD    Note: Above prescription for Corticosporin otic was clarified to be used in the left ear, not the right ear    Jacqulyn Cane, MD 03/17/19 1157

## 2019-03-28 ENCOUNTER — Other Ambulatory Visit: Payer: Self-pay | Admitting: Physician Assistant

## 2019-03-28 DIAGNOSIS — I10 Essential (primary) hypertension: Secondary | ICD-10-CM

## 2019-05-05 DIAGNOSIS — Z20828 Contact with and (suspected) exposure to other viral communicable diseases: Secondary | ICD-10-CM | POA: Diagnosis not present

## 2019-05-10 ENCOUNTER — Other Ambulatory Visit: Payer: Self-pay

## 2019-05-10 DIAGNOSIS — I10 Essential (primary) hypertension: Secondary | ICD-10-CM

## 2019-05-10 DIAGNOSIS — M48061 Spinal stenosis, lumbar region without neurogenic claudication: Secondary | ICD-10-CM | POA: Diagnosis not present

## 2019-05-10 DIAGNOSIS — M4316 Spondylolisthesis, lumbar region: Secondary | ICD-10-CM | POA: Diagnosis not present

## 2019-05-10 MED ORDER — LISINOPRIL-HYDROCHLOROTHIAZIDE 20-25 MG PO TABS: 1.0000 | ORAL_TABLET | Freq: Every day | ORAL | 0 refills | Status: AC

## 2019-06-07 DIAGNOSIS — I10 Essential (primary) hypertension: Secondary | ICD-10-CM | POA: Diagnosis not present

## 2019-06-07 DIAGNOSIS — E291 Testicular hypofunction: Secondary | ICD-10-CM | POA: Diagnosis not present

## 2019-06-07 DIAGNOSIS — E559 Vitamin D deficiency, unspecified: Secondary | ICD-10-CM | POA: Diagnosis not present

## 2019-06-07 DIAGNOSIS — R7303 Prediabetes: Secondary | ICD-10-CM | POA: Diagnosis not present

## 2019-06-08 DIAGNOSIS — M4316 Spondylolisthesis, lumbar region: Secondary | ICD-10-CM | POA: Diagnosis not present

## 2019-06-14 DIAGNOSIS — I10 Essential (primary) hypertension: Secondary | ICD-10-CM | POA: Diagnosis not present

## 2019-06-14 DIAGNOSIS — E291 Testicular hypofunction: Secondary | ICD-10-CM | POA: Diagnosis not present

## 2019-06-14 DIAGNOSIS — E559 Vitamin D deficiency, unspecified: Secondary | ICD-10-CM | POA: Diagnosis not present

## 2019-06-14 DIAGNOSIS — R7303 Prediabetes: Secondary | ICD-10-CM | POA: Diagnosis not present

## 2019-08-10 DIAGNOSIS — M4316 Spondylolisthesis, lumbar region: Secondary | ICD-10-CM | POA: Diagnosis not present

## 2019-09-07 DIAGNOSIS — L570 Actinic keratosis: Secondary | ICD-10-CM | POA: Diagnosis not present

## 2019-09-07 DIAGNOSIS — D225 Melanocytic nevi of trunk: Secondary | ICD-10-CM | POA: Diagnosis not present

## 2019-09-07 DIAGNOSIS — D485 Neoplasm of uncertain behavior of skin: Secondary | ICD-10-CM | POA: Diagnosis not present

## 2019-09-07 DIAGNOSIS — L814 Other melanin hyperpigmentation: Secondary | ICD-10-CM | POA: Diagnosis not present

## 2019-09-07 DIAGNOSIS — Z808 Family history of malignant neoplasm of other organs or systems: Secondary | ICD-10-CM | POA: Diagnosis not present

## 2019-09-07 DIAGNOSIS — L821 Other seborrheic keratosis: Secondary | ICD-10-CM | POA: Diagnosis not present

## 2019-09-07 DIAGNOSIS — L905 Scar conditions and fibrosis of skin: Secondary | ICD-10-CM | POA: Diagnosis not present

## 2019-09-07 DIAGNOSIS — L573 Poikiloderma of Civatte: Secondary | ICD-10-CM | POA: Diagnosis not present

## 2019-11-02 DIAGNOSIS — Z6826 Body mass index (BMI) 26.0-26.9, adult: Secondary | ICD-10-CM | POA: Diagnosis not present

## 2019-11-02 DIAGNOSIS — M4316 Spondylolisthesis, lumbar region: Secondary | ICD-10-CM | POA: Diagnosis not present

## 2019-11-02 DIAGNOSIS — M48061 Spinal stenosis, lumbar region without neurogenic claudication: Secondary | ICD-10-CM | POA: Diagnosis not present

## 2019-11-02 DIAGNOSIS — M5416 Radiculopathy, lumbar region: Secondary | ICD-10-CM | POA: Diagnosis not present

## 2019-11-23 DIAGNOSIS — E291 Testicular hypofunction: Secondary | ICD-10-CM | POA: Diagnosis not present

## 2019-11-23 DIAGNOSIS — R7303 Prediabetes: Secondary | ICD-10-CM | POA: Diagnosis not present

## 2019-11-23 DIAGNOSIS — E559 Vitamin D deficiency, unspecified: Secondary | ICD-10-CM | POA: Diagnosis not present

## 2019-11-23 DIAGNOSIS — I10 Essential (primary) hypertension: Secondary | ICD-10-CM | POA: Diagnosis not present

## 2019-12-13 IMAGING — DX DG CHEST 2V
2 series · 2 of 2 positions shown · non-contrast
Comparison: None.

CLINICAL DATA: Productive cough 2 weeks.

EXAM:
CHEST - 2 VIEW

[chest pa]
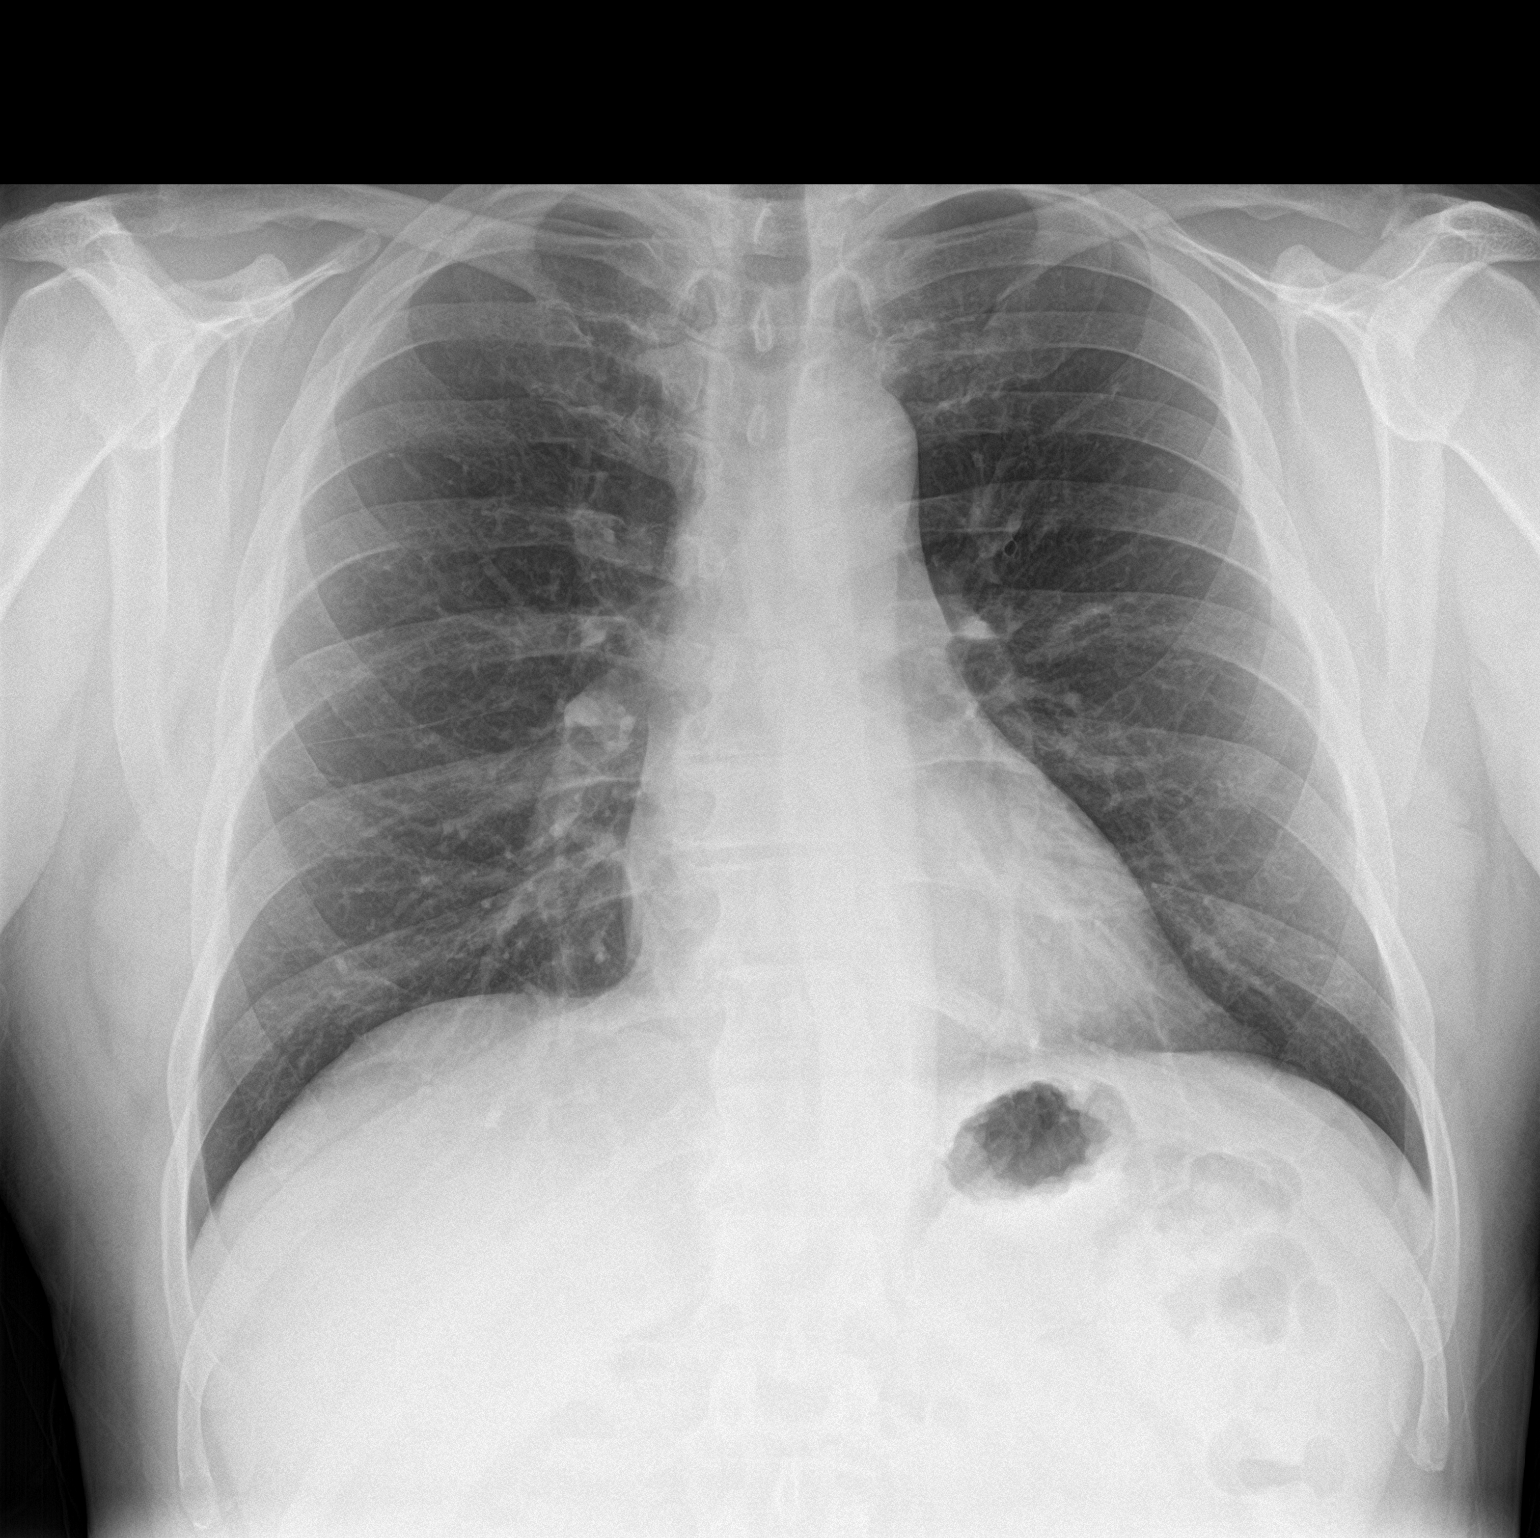

[chest lat]
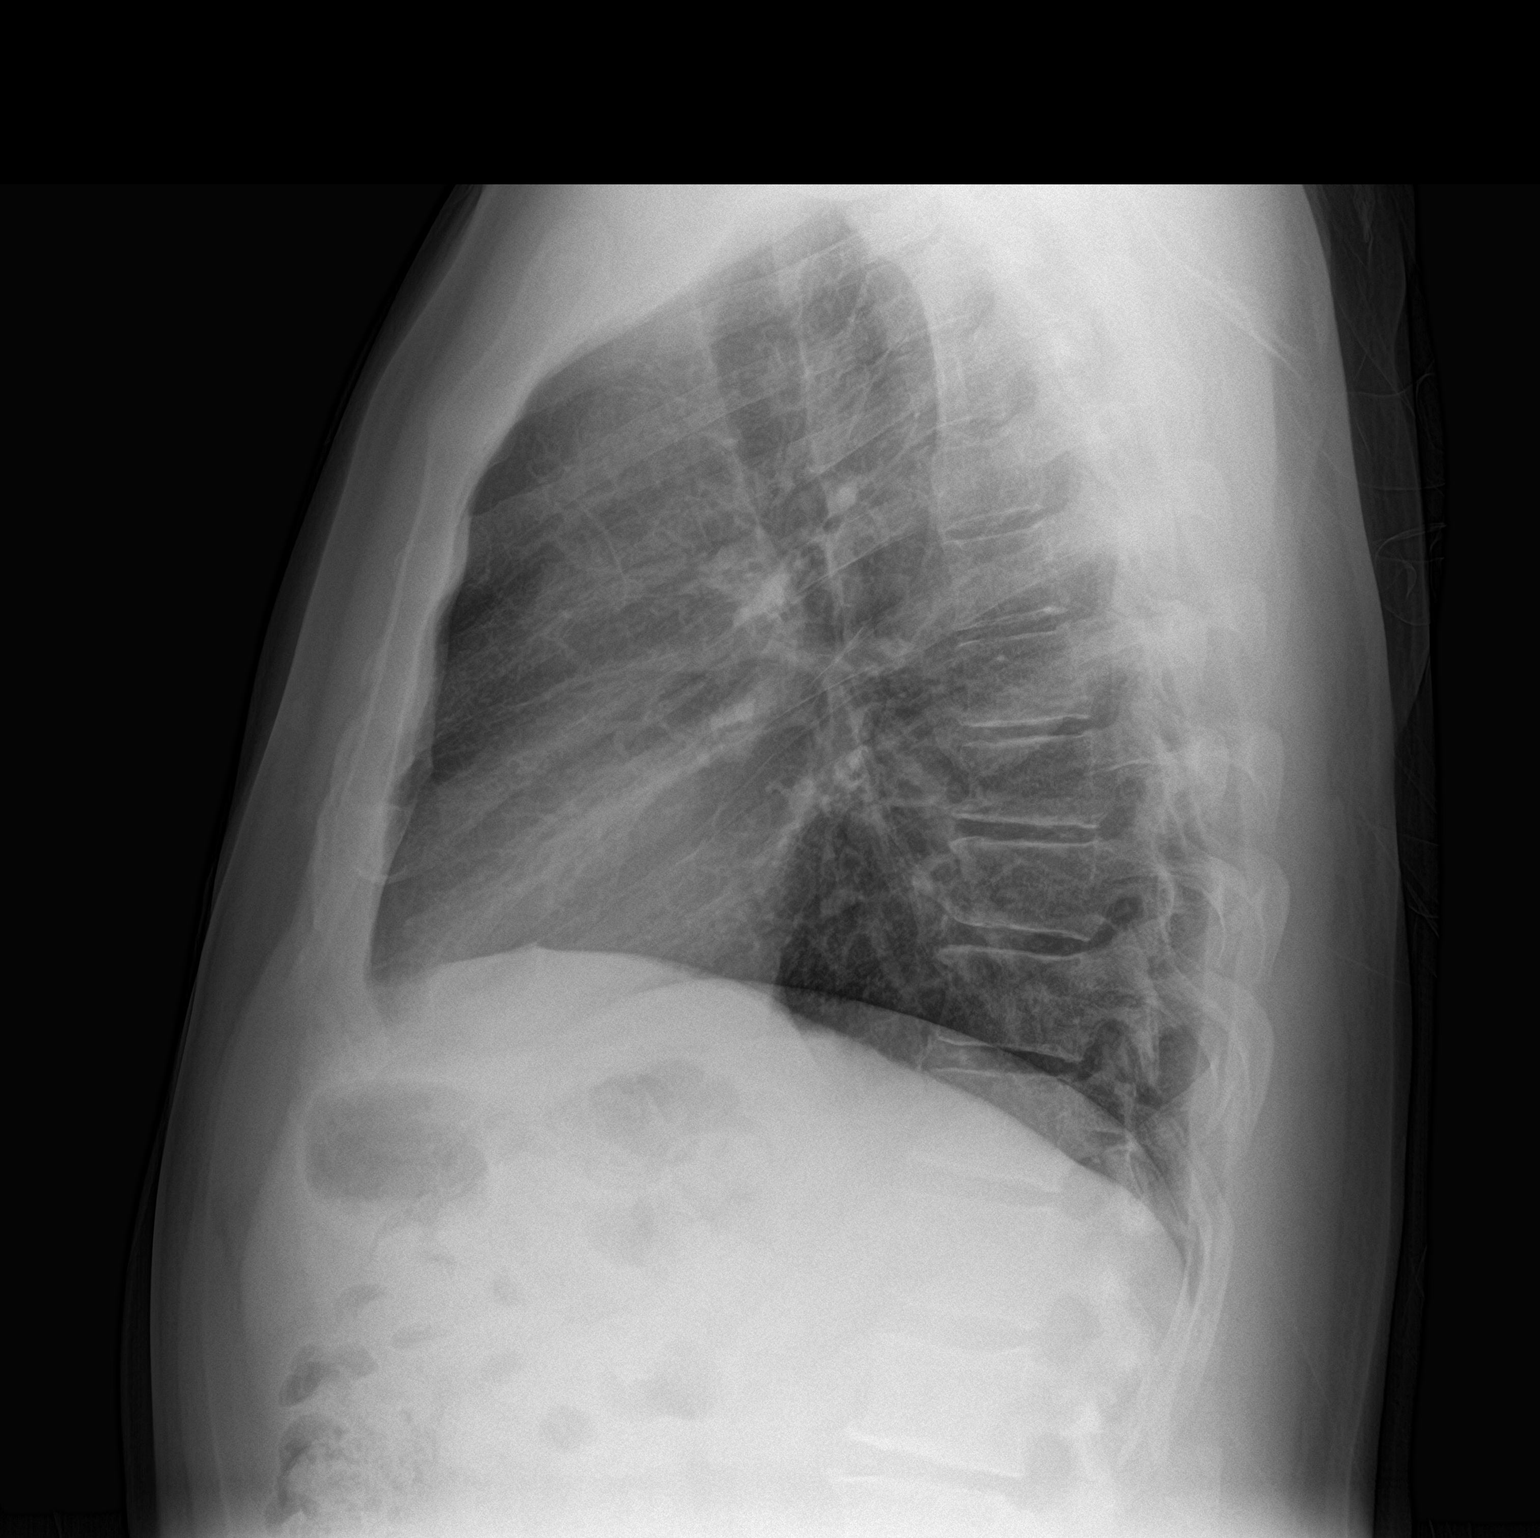

[2 of 2 positions shown; findings below may reference images not displayed]

FINDINGS: Lungs are adequately inflated and otherwise clear. Cardiomediastinal
silhouette is normal. Minimal degenerative change of the spine.
IMPRESSION: No active cardiopulmonary disease.

## 2019-12-14 DIAGNOSIS — E559 Vitamin D deficiency, unspecified: Secondary | ICD-10-CM | POA: Diagnosis not present

## 2019-12-14 DIAGNOSIS — E291 Testicular hypofunction: Secondary | ICD-10-CM | POA: Diagnosis not present

## 2019-12-14 DIAGNOSIS — I10 Essential (primary) hypertension: Secondary | ICD-10-CM | POA: Diagnosis not present

## 2019-12-14 DIAGNOSIS — R7303 Prediabetes: Secondary | ICD-10-CM | POA: Diagnosis not present

## 2020-01-12 ENCOUNTER — Encounter (HOSPITAL_COMMUNITY): Payer: Self-pay

## 2020-01-12 ENCOUNTER — Emergency Department (HOSPITAL_COMMUNITY)
Admission: EM | Admit: 2020-01-12 | Discharge: 2020-01-12 | Disposition: A | Discharge status: Home / Self Care | Payer: BC Managed Care – PPO | Attending: Emergency Medicine | Admitting: Emergency Medicine

## 2020-01-12 DIAGNOSIS — R110 Nausea: Secondary | ICD-10-CM | POA: Diagnosis not present

## 2020-01-12 DIAGNOSIS — Z5321 Procedure and treatment not carried out due to patient leaving prior to being seen by health care provider: Secondary | ICD-10-CM | POA: Diagnosis not present

## 2020-01-12 DIAGNOSIS — U071 COVID-19: Secondary | ICD-10-CM | POA: Insufficient documentation

## 2020-01-12 DIAGNOSIS — E860 Dehydration: Secondary | ICD-10-CM | POA: Insufficient documentation

## 2020-01-12 DIAGNOSIS — R61 Generalized hyperhidrosis: Secondary | ICD-10-CM | POA: Diagnosis not present

## 2020-01-12 DIAGNOSIS — R42 Dizziness and giddiness: Secondary | ICD-10-CM | POA: Diagnosis not present

## 2020-01-12 LAB — CBC
HCT: 49.6 % (ref 39.0–52.0)
Hemoglobin: 16.6 g/dL (ref 13.0–17.0)
MCH: 28.7 pg (ref 26.0–34.0)
MCHC: 33.5 g/dL (ref 30.0–36.0)
MCV: 85.8 fL (ref 80.0–100.0)
Platelets: 152 10*3/uL (ref 150–400)
RBC: 5.78 MIL/uL (ref 4.22–5.81)
RDW: 13.2 % (ref 11.5–15.5)
WBC: 2.9 10*3/uL — ABNORMAL LOW (ref 4.0–10.5)
nRBC: 0 % (ref 0.0–0.2)

## 2020-01-12 LAB — BASIC METABOLIC PANEL
Anion gap: 11 (ref 5–15)
BUN: 14 mg/dL (ref 6–20)
CO2: 26 mmol/L (ref 22–32)
Calcium: 8.9 mg/dL (ref 8.9–10.3)
Chloride: 98 mmol/L (ref 98–111)
Creatinine, Ser: 0.97 mg/dL (ref 0.61–1.24)
GFR calc Af Amer: >60 mL/min (ref >60)
GFR calc non Af Amer: >60 mL/min (ref >60)
Glucose, Bld: 112 mg/dL — ABNORMAL HIGH (ref 70–99)
Potassium: 4 mmol/L (ref 3.5–5.1)
Sodium: 135 mmol/L (ref 135–145)

## 2020-01-12 LAB — SARS CORONAVIRUS 2 BY RT PCR (HOSPITAL ORDER, PERFORMED IN ~~LOC~~ HOSPITAL LAB): SARS Coronavirus 2: POSITIVE — AB

## 2020-01-12 NOTE — ED Triage Notes (Signed)
Pt arrives to ED via [facility] w/ c/o dehydration. Pt tested positive for covid approx 1 week ago and has been nauseated since then and not able to keep down water. Resp e/u, NAD.

## 2020-01-12 NOTE — ED Notes (Signed)
Pt is not willing to wait to be seen. Stated that he is dehydrated and need some fluids going to try urgent care.

## 2020-03-29 DIAGNOSIS — I10 Essential (primary) hypertension: Secondary | ICD-10-CM | POA: Diagnosis not present

## 2020-03-29 DIAGNOSIS — E291 Testicular hypofunction: Secondary | ICD-10-CM | POA: Diagnosis not present

## 2020-03-29 DIAGNOSIS — E559 Vitamin D deficiency, unspecified: Secondary | ICD-10-CM | POA: Diagnosis not present

## 2020-03-29 DIAGNOSIS — R7303 Prediabetes: Secondary | ICD-10-CM | POA: Diagnosis not present

## 2020-04-17 DIAGNOSIS — R7303 Prediabetes: Secondary | ICD-10-CM | POA: Diagnosis not present

## 2020-04-17 DIAGNOSIS — I10 Essential (primary) hypertension: Secondary | ICD-10-CM | POA: Diagnosis not present

## 2020-04-17 DIAGNOSIS — E559 Vitamin D deficiency, unspecified: Secondary | ICD-10-CM | POA: Diagnosis not present

## 2020-04-17 DIAGNOSIS — E291 Testicular hypofunction: Secondary | ICD-10-CM | POA: Diagnosis not present
